# Patient Record
Sex: Male | Born: 1945 | Race: White | Hispanic: No | Marital: Married | Smoking: Never smoker
Health system: Southern US, Community
[De-identification: ages and names within clinical notes are randomized; demographics above are authoritative.]

## PROBLEM LIST (undated history)

## (undated) DIAGNOSIS — I10 Essential (primary) hypertension: Secondary | ICD-10-CM

## (undated) DIAGNOSIS — N200 Calculus of kidney: Secondary | ICD-10-CM

## (undated) DIAGNOSIS — E7800 Pure hypercholesterolemia, unspecified: Secondary | ICD-10-CM

## (undated) DIAGNOSIS — G4730 Sleep apnea, unspecified: Secondary | ICD-10-CM

## (undated) DIAGNOSIS — M1990 Unspecified osteoarthritis, unspecified site: Secondary | ICD-10-CM

## (undated) HISTORY — DX: Pure hypercholesterolemia, unspecified: E78.00

## (undated) HISTORY — DX: Unspecified osteoarthritis, unspecified site: M19.90

## (undated) HISTORY — DX: Calculus of kidney: N20.0

## (undated) HISTORY — DX: Essential (primary) hypertension: I10

## (undated) HISTORY — DX: Sleep apnea, unspecified: G47.30

---

## 2008-08-06 ENCOUNTER — Emergency Department: Payer: Self-pay | Admitting: Emergency Medicine

## 2008-08-08 ENCOUNTER — Ambulatory Visit: Payer: Self-pay | Admitting: Urology

## 2008-08-10 ENCOUNTER — Ambulatory Visit: Payer: Self-pay | Admitting: Urology

## 2008-08-18 ENCOUNTER — Ambulatory Visit: Payer: Self-pay | Admitting: Urology

## 2008-09-07 ENCOUNTER — Ambulatory Visit: Payer: Self-pay | Admitting: Urology

## 2008-11-10 ENCOUNTER — Ambulatory Visit: Payer: Self-pay | Admitting: Urology

## 2009-03-21 ENCOUNTER — Ambulatory Visit: Payer: Self-pay | Admitting: Gastroenterology

## 2011-02-21 ENCOUNTER — Ambulatory Visit: Payer: Self-pay | Admitting: Orthopedic Surgery

## 2011-08-25 ENCOUNTER — Ambulatory Visit: Payer: Self-pay | Admitting: Urology

## 2011-12-04 ENCOUNTER — Ambulatory Visit: Payer: Self-pay | Admitting: Internal Medicine

## 2012-09-06 ENCOUNTER — Ambulatory Visit: Payer: Self-pay | Admitting: Urology

## 2014-03-27 ENCOUNTER — Ambulatory Visit: Payer: Self-pay | Admitting: Gastroenterology

## 2014-05-28 NOTE — Op Note (Signed)
PATIENT NAME:  Joshua Charles, Joshua Charles MR#:  [id number] DATE OF BIRTH:  1946/02/02  DATE OF PROCEDURE:  02/21/2011  PREOPERATIVE DIAGNOSIS: Left wrist volar ganglion cyst.   POSTOPERATIVE DIAGNOSIS: Left wrist volar ganglion cyst.   PROCEDURE: Excision ganglion cyst left volar wrist.   SURGEON: Laurene Footman, MD  ANESTHESIA: General.    DESCRIPTION OF PROCEDURE: Patient was brought to the Operating Room and after adequate anesthesia was obtained left arm was prepped and draped in the usual sterile fashion with a tourniquet applied to the upper arm. After patient identification and timeout procedures were completed, the tourniquet was raised to 250 mmHg and a curvilinear incision was made over the mass. On opening the subcutaneous skin with blunt dissection the mass was approximately 0.5 cm in size. It was tracked down to its base and then the superficial portion cut off and sent as specimen. The base was identified arising from the radiocarpal joint and cauterized and then a small elevator used to abrade the adjacent tissue to cause scar tissue to prevent recurrence. After this had been completed, the wound was irrigated and the tourniquet let down. Radial artery was intact and there was minimal bleeding. The wounds were closed with simple interrupted 4-0 nylon skin closure. Next Xeroform, 4 x 4's, Webril, and a volar splint were applied. Patient tolerated the procedure well, sent to recovery room in stable condition.   ESTIMATED BLOOD LOSS: Minimal.   COMPLICATIONS: None.   SPECIMEN: Ganglion cyst.   TOURNIQUET TIME: 7 minutes at 250 mmHg. ____________________________ Laurene Footman, MD mjm:cms D: 02/21/2011 18:07:50 ET T: 02/22/2011 09:17:04 ET  JOB#: [id number] Joshua Mate [name] MD ELECTRONICALLY SIGNED 02/23/2011 14:48

## 2017-02-03 HISTORY — PX: OTHER SURGICAL HISTORY: SHX169

## 2017-10-16 ENCOUNTER — Encounter: Payer: Self-pay | Admitting: Urology

## 2017-10-16 ENCOUNTER — Ambulatory Visit
Admission: RE | Admit: 2017-10-16 | Discharge: 2017-10-16 | Disposition: A | Discharge status: Home / Self Care | Payer: Medicare HMO | Source: Ambulatory Visit | Attending: Urology | Admitting: Urology

## 2017-10-16 ENCOUNTER — Ambulatory Visit (INDEPENDENT_AMBULATORY_CARE_PROVIDER_SITE_OTHER): Payer: Medicare HMO | Admitting: Urology

## 2017-10-16 VITALS — BP 174/64 | HR 77 | Ht 66.0 in | Wt 141.5 lb

## 2017-10-16 DIAGNOSIS — N200 Calculus of kidney: Secondary | ICD-10-CM | POA: Insufficient documentation

## 2017-10-16 DIAGNOSIS — Z87442 Personal history of urinary calculi: Secondary | ICD-10-CM | POA: Diagnosis present

## 2017-10-16 DIAGNOSIS — Z029 Encounter for administrative examinations, unspecified: Secondary | ICD-10-CM | POA: Diagnosis present

## 2017-10-16 LAB — URINALYSIS, COMPLETE
Bilirubin, UA: NEGATIVE
Glucose, UA: NEGATIVE
Ketones, UA: NEGATIVE
LEUKOCYTES UA: NEGATIVE
Nitrite, UA: NEGATIVE
PH UA: 8 — AB (ref 5.0–7.5)
Protein, UA: NEGATIVE
RBC UA: NEGATIVE
SPEC GRAV UA: 1.02 (ref 1.005–1.030)
Urobilinogen, Ur: 0.2 mg/dL (ref 0.2–1.0)

## 2017-10-16 NOTE — Progress Notes (Signed)
10/16/2017 12:29 PM   Joshua Charles 13-Mar-1945 [id number]  Referring provider: No referring provider defined for this encounter.  Chief Complaint  Patient presents with  . Follow-up    HPI: 72 year old male with a history of stone disease presents for follow-up.  He is status post shockwave lithotripsy of a 5 x 6 mm right distal ureteral calculus in September 2016.  He has had no recurrent stones.  I last saw him at [facility] in October 2017.  He presently has no complaints and specifically denies flank, abdominal, pelvic or scrotal pain.  He denies bothersome lower urinary tract symptoms.  Denies dysuria or gross hematuria.  KUB in 2017 showed no evidence of recurrent calculi.   PMH: Past Medical History:  Diagnosis Date  . Arthritis   . High blood pressure   . High cholesterol   . Kidney stones   . Sleep apnea     Surgical History: Past Surgical History:  Procedure Laterality Date  . Eye  2019  . Inspire Device   2019    Home Medications:  Allergies as of 10/16/2017   Not on File     Medication List    as of 10/16/2017 12:29 PM   You have not been prescribed any medications.     Allergies: Allergies not on file  Family History: Family History  Problem Relation Age of Onset  . Hematuria Father   . Prostate cancer Father     Social History:  reports that he has never smoked. He has never used smokeless tobacco. He reports that he does not drink alcohol or use drugs.  ROS: UROLOGY Frequent Urination?: No Hard to postpone urination?: No Burning/pain with urination?: No Get up at night to urinate?: No Leakage of urine?: Yes Urine stream starts and stops?: No Trouble starting stream?: No Do you have to strain to urinate?: No Blood in urine?: No Urinary tract infection?: No Sexually transmitted disease?: No Injury to kidneys or bladder?: No Painful intercourse?: No Weak stream?: Yes Erection problems?: No Penile pain?:  No  Gastrointestinal Nausea?: No Vomiting?: No Indigestion/heartburn?: No Diarrhea?: No Constipation?: No  Constitutional Fever: No Night sweats?: No Weight loss?: No Fatigue?: No  Skin Skin rash/lesions?: No Itching?: No  Eyes Blurred vision?: No Double vision?: No  Ears/Nose/Throat Sore throat?: No Sinus problems?: No  Hematologic/Lymphatic Swollen glands?: No Easy bruising?: Yes  Cardiovascular Leg swelling?: No Chest pain?: No  Respiratory Cough?: No Shortness of breath?: No  Endocrine Excessive thirst?: No  Musculoskeletal Back pain?: Yes Joint pain?: Yes  Neurological Headaches?: No Dizziness?: No  Psychologic Depression?: No Anxiety?: No  Physical Exam: BP (!) 174/64   Pulse 77   Ht 5\' 6"  (1.676 m)   Wt 141 lb 8 oz (64.2 kg)   BMI 22.84 kg/m   Constitutional:  Alert and oriented, No acute distress. HEENT: [place] AT, moist mucus membranes.  Trachea midline, no masses. Cardiovascular: No clubbing, cyanosis, or edema. Respiratory: Normal respiratory effort, no increased work of breathing. GI: Abdomen is soft, nontender, nondistended, no abdominal masses GU: No CVA tenderness Lymph: No cervical or inguinal lymphadenopathy. Skin: No rashes, bruises or suspicious lesions. Neurologic: Grossly intact, no focal deficits, moving all 4 extremities. Psychiatric: Normal mood and affect.  Laboratory Data:  Urinalysis Dipstick/microscopy negative  Assessment & Plan:   72 year old male with a history of stone disease.  He is presently asymptomatic.  A KUB was ordered and he will be notified with results.  Every other year follow-up  or visits as needed for stone symptoms.   Abbie Sons, [place] [address], [place],  [address] [phone]

## 2017-10-17 ENCOUNTER — Encounter: Payer: Self-pay | Admitting: Urology

## 2017-10-19 ENCOUNTER — Telehealth: Payer: Self-pay

## 2017-10-19 DIAGNOSIS — N200 Calculus of kidney: Secondary | ICD-10-CM

## 2017-10-19 NOTE — Telephone Encounter (Signed)
-----   Message from Abbie Sons, MD sent at 10/17/2017  8:38 PM EDT ----- KUB shows probable small bilateral renal calculi. Can monitor- recc kub 1 year

## 2017-10-19 NOTE — Telephone Encounter (Signed)
Called pt informed his wife of results (per DPR) wife gave verbal understanding.

## 2017-10-22 ENCOUNTER — Other Ambulatory Visit: Payer: Self-pay

## 2017-10-22 ENCOUNTER — Ambulatory Visit
Admission: RE | Admit: 2017-10-22 | Discharge: 2017-10-22 | Disposition: A | Discharge status: Home / Self Care | Payer: Medicare HMO | Attending: Urology | Admitting: Urology

## 2017-10-22 DIAGNOSIS — Z029 Encounter for administrative examinations, unspecified: Secondary | ICD-10-CM | POA: Insufficient documentation

## 2018-12-19 ENCOUNTER — Other Ambulatory Visit: Payer: Self-pay | Admitting: Urology

## 2018-12-19 DIAGNOSIS — N200 Calculus of kidney: Secondary | ICD-10-CM

## 2019-09-12 ENCOUNTER — Emergency Department (HOSPITAL_COMMUNITY)
Admission: EM | Admit: 2019-09-12 | Discharge: 2019-09-12 | Disposition: A | Discharge status: Home / Self Care | Payer: Medicare HMO | Attending: Emergency Medicine | Admitting: Emergency Medicine

## 2019-09-12 ENCOUNTER — Emergency Department (HOSPITAL_COMMUNITY): Payer: Medicare HMO

## 2019-09-12 ENCOUNTER — Other Ambulatory Visit: Payer: Self-pay

## 2019-09-12 ENCOUNTER — Encounter (HOSPITAL_COMMUNITY): Payer: Self-pay | Admitting: *Deleted

## 2019-09-12 DIAGNOSIS — Z7982 Long term (current) use of aspirin: Secondary | ICD-10-CM | POA: Diagnosis not present

## 2019-09-12 DIAGNOSIS — I10 Essential (primary) hypertension: Secondary | ICD-10-CM | POA: Diagnosis not present

## 2019-09-12 DIAGNOSIS — K529 Noninfective gastroenteritis and colitis, unspecified: Secondary | ICD-10-CM | POA: Insufficient documentation

## 2019-09-12 DIAGNOSIS — Z79899 Other long term (current) drug therapy: Secondary | ICD-10-CM | POA: Insufficient documentation

## 2019-09-12 DIAGNOSIS — R109 Unspecified abdominal pain: Secondary | ICD-10-CM | POA: Diagnosis present

## 2019-09-12 LAB — CBC
HCT: 44.9 % (ref 39.0–52.0)
Hemoglobin: 14.5 g/dL (ref 13.0–17.0)
MCH: 32.1 pg (ref 26.0–34.0)
MCHC: 32.3 g/dL (ref 30.0–36.0)
MCV: 99.3 fL (ref 80.0–100.0)
Platelets: 308 10*3/uL (ref 150–400)
RBC: 4.52 MIL/uL (ref 4.22–5.81)
RDW: 13.2 % (ref 11.5–15.5)
WBC: 13.9 10*3/uL — ABNORMAL HIGH (ref 4.0–10.5)
nRBC: 0 % (ref 0.0–0.2)

## 2019-09-12 LAB — COMPREHENSIVE METABOLIC PANEL
ALT: 15 U/L (ref 0–44)
AST: 11 U/L — ABNORMAL LOW (ref 15–41)
Albumin: 4.1 g/dL (ref 3.5–5.0)
Alkaline Phosphatase: 38 U/L (ref 38–126)
Anion gap: 8 (ref 5–15)
BUN: 15 mg/dL (ref 8–23)
CO2: 26 mmol/L (ref 22–32)
Calcium: 8.7 mg/dL — ABNORMAL LOW (ref 8.9–10.3)
Chloride: 104 mmol/L (ref 98–111)
Creatinine, Ser: 1.09 mg/dL (ref 0.61–1.24)
GFR calc Af Amer: >60 mL/min (ref >60)
GFR calc non Af Amer: >60 mL/min (ref >60)
Glucose, Bld: 136 mg/dL — ABNORMAL HIGH (ref 70–99)
Potassium: 4.6 mmol/L (ref 3.5–5.1)
Sodium: 138 mmol/L (ref 135–145)
Total Bilirubin: 0.4 mg/dL (ref 0.3–1.2)
Total Protein: 7.1 g/dL (ref 6.5–8.1)

## 2019-09-12 LAB — URINALYSIS, ROUTINE W REFLEX MICROSCOPIC
Bacteria, UA: NONE SEEN
Bilirubin Urine: NEGATIVE
Glucose, UA: NEGATIVE mg/dL
Ketones, ur: 5 mg/dL — AB
Leukocytes,Ua: NEGATIVE
Nitrite: NEGATIVE
Protein, ur: 100 mg/dL — AB
Specific Gravity, Urine: 1.039 — ABNORMAL HIGH (ref 1.005–1.030)
pH: 5 (ref 5.0–8.0)

## 2019-09-12 LAB — LIPASE, BLOOD: Lipase: 62 U/L — ABNORMAL HIGH (ref 11–51)

## 2019-09-12 LAB — POC OCCULT BLOOD, ED: Fecal Occult Bld: POSITIVE — AB

## 2019-09-12 MED ORDER — AMOXICILLIN-POT CLAVULANATE 875-125 MG PO TABS: 1.0000 | ORAL_TABLET | Freq: Two times a day (BID) | ORAL | 0 refills | Status: DC

## 2019-09-12 MED ORDER — SODIUM CHLORIDE 0.9% FLUSH
3.0000 mL | Freq: Once | INTRAVENOUS | Status: AC
  Administered 2019-09-12: 3 mL via INTRAVENOUS

## 2019-09-12 MED ORDER — ONDANSETRON HCL 4 MG/2ML IJ SOLN
4.0000 mg | Freq: Once | INTRAMUSCULAR | Status: AC
  Administered 2019-09-12: 4 mg via INTRAVENOUS
  Filled 2019-09-12: qty 2

## 2019-09-12 MED ORDER — IOHEXOL 300 MG/ML  SOLN
100.0000 mL | Freq: Once | INTRAMUSCULAR | Status: AC | PRN
  Administered 2019-09-12: 100 mL via INTRAVENOUS

## 2019-09-12 MED ORDER — ONDANSETRON 4 MG PO TBDP: 4.0000 mg | ORAL_TABLET | Freq: Three times a day (TID) | ORAL | 0 refills | Status: AC | PRN

## 2019-09-12 MED ORDER — MORPHINE SULFATE (PF) 2 MG/ML IV SOLN
2.0000 mg | Freq: Once | INTRAVENOUS | Status: AC
  Administered 2019-09-12: 2 mg via INTRAVENOUS
  Filled 2019-09-12: qty 1

## 2019-09-12 NOTE — Discharge Instructions (Addendum)
Take Augmentin as prescribed.  Take Zofran as needed for nausea.  Please follow-up with your primary care provider in 2 5 days for continued evaluation.  Return to the emergency room immediately for new or worsening symptoms or concerns, such as new or worsening abdominal pain, fevers, vomiting or any concerns at all.

## 2019-09-12 NOTE — ED Triage Notes (Signed)
Right lower quadrant pain x 2 days with some bloody stools

## 2019-09-12 NOTE — ED Provider Notes (Signed)
[facility] EMERGENCY DEPARTMENT Provider Note   CSN: [id number] Arrival date & time: 09/12/19  1207     History Chief Complaint  Patient presents with  . Abdominal Pain    Joshua Charles is a 74 y.o. male.  HPI  74 year old male, with a PMH of HTN, HLD, presents with right lower quadrant abdominal pain.  Patient states her last 2 days he has had right lower quadrant abdominal pain.  He denies any radiation of pain, aggravating or alleviating factors.  He notes associated nausea and vomiting this morning x1.  He states diarrhea daily with approximately 10 episodes of loose stools.  He notes that he has had some bright red blood intermittently in his stools, noted in the toilet with several drops of bright red blood.  States today he had bright red blood noted on the toilet paper.  He denies any hematuria, dysuria, urinary urgency.  He denies any fevers, chills, chest pain, shortness of breath.    Past Medical History:  Diagnosis Date  . Arthritis   . High blood pressure   . High cholesterol   . Kidney stones   . Sleep apnea     There are no problems to display for this patient.   Past Surgical History:  Procedure Laterality Date  . Eye  2019  . Inspire Device   2019       Family History  Problem Relation Age of Onset  . Hematuria Father   . Prostate cancer Father     Social History   Tobacco Use  . Smoking status: Never Smoker  . Smokeless tobacco: Never Used  Substance Use Topics  . Alcohol use: Never  . Drug use: Never    Home Medications Prior to Admission medications   Medication Sig Start Date End Date Taking? Authorizing Provider  acetaminophen (TYLENOL) 500 MG tablet Take 1 tablet by mouth every 6 (six) hours as needed for pain.   Yes [provider]  aspirin 81 MG EC tablet Take 1 tablet by mouth daily. 03/15/13  Yes [provider]  losartan (COZAAR) 25 MG tablet Take 25 mg by mouth daily. 08/16/19  Yes [provider]   simvastatin (ZOCOR) 40 MG tablet Take 40 mg by mouth at bedtime. 07/18/19  Yes [provider]    Allergies    Patient has no known allergies.  Review of Systems   Review of Systems  Constitutional: Negative for chills and fever.  Respiratory: Negative for shortness of breath.   Cardiovascular: Negative for chest pain.  Gastrointestinal: Positive for abdominal pain, diarrhea, nausea and vomiting.  Genitourinary: Negative for hematuria and testicular pain.  All other systems reviewed and are negative.   Physical Exam Updated Vital Signs BP (!) 173/81 (BP Location: Right Arm)   Pulse 77   Temp 97.6 F (36.4 C) (Oral)   Resp 20   Ht 5\' 6"  (1.676 m)   Wt 64.9 kg   SpO2 100%   BMI 23.08 kg/m   Physical Exam Vitals and nursing note reviewed.  Constitutional:      Appearance: He is well-developed.  HENT:     Head: Normocephalic and atraumatic.  Eyes:     Conjunctiva/sclera: Conjunctivae normal.  Cardiovascular:     Rate and Rhythm: Normal rate and regular rhythm.     Heart sounds: Normal heart sounds. No murmur heard.   Pulmonary:     Effort: Pulmonary effort is normal. No respiratory distress.     Breath sounds:  Normal breath sounds. No wheezing or rales.  Abdominal:     General: Bowel sounds are normal. There is no distension.     Palpations: Abdomen is soft.     Tenderness: There is abdominal tenderness in the right lower quadrant. There is guarding. Positive signs include McBurney's sign.  Musculoskeletal:        General: No tenderness or deformity. Normal range of motion.     Cervical back: Neck supple.  Skin:    General: Skin is warm and dry.     Findings: No erythema or rash.  Neurological:     Mental Status: He is alert and oriented to person, place, and time.  Psychiatric:        Behavior: Behavior normal.     ED Results / Procedures / Treatments   Labs (all labs ordered are listed, but only abnormal results are displayed) Labs Reviewed    LIPASE, BLOOD - Abnormal; Notable for the following components:      Result Value   Lipase 62 (*)    All other components within normal limits  COMPREHENSIVE METABOLIC PANEL - Abnormal; Notable for the following components:   Glucose, Bld 136 (*)    Calcium 8.7 (*)    AST 11 (*)    All other components within normal limits  CBC - Abnormal; Notable for the following components:   WBC 13.9 (*)    All other components within normal limits  URINALYSIS, ROUTINE W REFLEX MICROSCOPIC - Abnormal; Notable for the following components:   Color, Urine AMBER (*)    APPearance HAZY (*)    Specific Gravity, Urine 1.039 (*)    Hgb urine dipstick SMALL (*)    Ketones, ur 5 (*)    Protein, ur 100 (*)    All other components within normal limits  POC OCCULT BLOOD, ED - Abnormal; Notable for the following components:   Fecal Occult Bld POSITIVE (*)    All other components within normal limits    EKG None  Radiology CT Abdomen Pelvis W Contrast  Result Date: 09/12/2019 CLINICAL DATA:  Right lower quadrant pain for 2 days.  Bloody stool. EXAM: CT ABDOMEN AND PELVIS WITH CONTRAST TECHNIQUE: Multidetector CT imaging of the abdomen and pelvis was performed using the standard protocol following bolus administration of intravenous contrast. CONTRAST:  156mL OMNIPAQUE IOHEXOL 300 MG/ML  SOLN COMPARISON:  [facility] CT [facility] FINDINGS: Lower Chest: No acute findings. Hepatobiliary: No hepatic masses identified. Gallbladder is unremarkable. No evidence of biliary ductal dilatation. Pancreas:  No mass or inflammatory changes. Spleen: Within normal limits in size and appearance. Adrenals/Urinary Tract: No masses identified. A few small cysts are noted in the upper pole of the left kidney. 2 mm nonobstructing calculus is seen in the midpole of the right kidney. No evidence of ureteral calculi or hydronephrosis. Stomach/Bowel: Moderate diffuse colonic wall thickening is  seen with greatest involvement of the ascending colon, consistent with severe colitis. Tiny amount of free fluid is noted in the right paracolic gutter and pelvic cul-de-sac, however there is no evidence of abscess, pneumatosis, or extraluminal air. Small bowel is unremarkable in appearance, and there is no evidence of bowel obstruction. Diverticulosis is seen in the sigmoid colon. Vascular/Lymphatic: No pathologically enlarged lymph nodes. No abdominal aortic aneurysm. Aortic atherosclerosis noted. Reproductive:  No mass or other significant abnormality. Other:  None. Musculoskeletal:  No suspicious bone lesions identified. IMPRESSION: Moderate to severe diffuse colitis, likely infectious in etiology with  less likely differential diagnosis of ulcerative colitis. Tiny nonobstructing right renal calculus. Aortic Atherosclerosis (ICD10-I70.0). Electronically Signed   By: Marlaine Hind M.D.   On: 09/12/2019 16:48    Procedures Procedures (including critical care time)  Medications Ordered in ED Medications  sodium chloride flush (NS) 0.9 % injection 3 mL (3 mLs Intravenous Given 09/12/19 1548)  morphine 2 MG/ML injection 2 mg (2 mg Intravenous Given 09/12/19 1547)  ondansetron (ZOFRAN) injection 4 mg (4 mg Intravenous Given 09/12/19 1547)  iohexol (OMNIPAQUE) 300 MG/ML solution 100 mL (100 mLs Intravenous Contrast Given 09/12/19 1620)    ED Course  I have reviewed the triage vital signs and the nursing notes.  Pertinent labs & imaging results that were available during my care of the patient were reviewed by me and considered in my medical decision making (see chart for details).    MDM Rules/Calculators/A&P                           Patient presents with right lower quadrant abdominal pain.  He did note multiple episodes of diarrhea and one episode of vomiting today.  He is feeling better after morphine and Zofran.  His CBC shows a slightly elevated white count.  CMP with normal kidney function, normal  LFTs.  Lipase only slightly elevated.  Fecal occult positive however denies a large amount of bright red blood.  He notes only several drops in the toilet.  Hemoglobin is normal.  He received a CT scan which shows moderate to severe colitis.  Discussed outpatient management and patient and wife are agreeable.  Will discharge with Augmentin.  Given very strict return precautions and patient is agreeable with plan.  He is ready and stable for discharge.    At this time there does not appear to be any evidence of an acute emergency medical condition and the patient appears stable for discharge with appropriate outpatient follow up.Diagnosis was discussed with patient who verbalizes understanding and is agreeable to discharge. Pt case discussed with Dr. Rogene Houston who agrees with my plan.     Final Clinical Impression(s) / ED Diagnoses Final diagnoses:  None    Rx / DC Orders ED Discharge Orders    None       Rachel Moulds 09/12/19 2025    Fredia Sorrow, MD 09/13/19 1556

## 2019-09-12 NOTE — ED Provider Notes (Signed)
Medical screening examination/treatment/procedure(s) were conducted as a shared visit with non-physician practitioner(s) and myself.  I personally evaluated the patient during the encounter.     Patient seen by me along with physician assistant.  Patient presenting with right lower quadrant abdominal pain for 2 days and some bloody stools.  Patient's vital signs without any significant abnormalities.  Hemoccult positive.  Labs significant for leukocytosis.  CT scan of the abdomen consistent with a colitis.  Listed as a moderate to severe diffuse colitis.  Most likely infectious in etiology.  They feel it is not related ulcerative colitis.  Otherwise no significant findings.  Patient will be treated with Augmentin follow-up with primary care doctor or return for any new or worse symptoms.  Patient stable for discharge home nontoxic no acute distress.    Fredia Sorrow, MD 09/12/19 [phone]

## 2019-10-28 ENCOUNTER — Ambulatory Visit: Payer: Self-pay

## 2021-01-12 ENCOUNTER — Emergency Department (HOSPITAL_COMMUNITY)
Admission: EM | Admit: 2021-01-12 | Discharge: 2021-01-12 | Disposition: A | Discharge status: Home / Self Care | Payer: Medicare HMO | Attending: Emergency Medicine | Admitting: Emergency Medicine

## 2021-01-12 ENCOUNTER — Emergency Department (HOSPITAL_COMMUNITY): Payer: Medicare HMO

## 2021-01-12 ENCOUNTER — Ambulatory Visit: Admission: EM | Admit: 2021-01-12 | Discharge: 2021-01-12 | Payer: Medicare HMO

## 2021-01-12 ENCOUNTER — Other Ambulatory Visit: Payer: Self-pay

## 2021-01-12 ENCOUNTER — Encounter: Payer: Self-pay | Admitting: Emergency Medicine

## 2021-01-12 ENCOUNTER — Encounter (HOSPITAL_COMMUNITY): Payer: Self-pay | Admitting: *Deleted

## 2021-01-12 DIAGNOSIS — M533 Sacrococcygeal disorders, not elsewhere classified: Secondary | ICD-10-CM

## 2021-01-12 DIAGNOSIS — K5730 Diverticulosis of large intestine without perforation or abscess without bleeding: Secondary | ICD-10-CM | POA: Insufficient documentation

## 2021-01-12 DIAGNOSIS — W19XXXA Unspecified fall, initial encounter: Secondary | ICD-10-CM

## 2021-01-12 DIAGNOSIS — S22070A Wedge compression fracture of T9-T10 vertebra, initial encounter for closed fracture: Secondary | ICD-10-CM

## 2021-01-12 DIAGNOSIS — M2550 Pain in unspecified joint: Secondary | ICD-10-CM

## 2021-01-12 DIAGNOSIS — I10 Essential (primary) hypertension: Secondary | ICD-10-CM | POA: Insufficient documentation

## 2021-01-12 DIAGNOSIS — W11XXXA Fall on and from ladder, initial encounter: Secondary | ICD-10-CM | POA: Diagnosis not present

## 2021-01-12 DIAGNOSIS — M79642 Pain in left hand: Secondary | ICD-10-CM

## 2021-01-12 DIAGNOSIS — I700 Atherosclerosis of aorta: Secondary | ICD-10-CM | POA: Diagnosis not present

## 2021-01-12 DIAGNOSIS — M79652 Pain in left thigh: Secondary | ICD-10-CM | POA: Insufficient documentation

## 2021-01-12 DIAGNOSIS — M542 Cervicalgia: Secondary | ICD-10-CM

## 2021-01-12 DIAGNOSIS — M4854XA Collapsed vertebra, not elsewhere classified, thoracic region, initial encounter for fracture: Secondary | ICD-10-CM | POA: Insufficient documentation

## 2021-01-12 DIAGNOSIS — S0001XA Abrasion of scalp, initial encounter: Secondary | ICD-10-CM | POA: Diagnosis not present

## 2021-01-12 DIAGNOSIS — K5792 Diverticulitis of intestine, part unspecified, without perforation or abscess without bleeding: Secondary | ICD-10-CM | POA: Insufficient documentation

## 2021-01-12 DIAGNOSIS — M546 Pain in thoracic spine: Secondary | ICD-10-CM

## 2021-01-12 DIAGNOSIS — M549 Dorsalgia, unspecified: Secondary | ICD-10-CM | POA: Diagnosis present

## 2021-01-12 DIAGNOSIS — M545 Low back pain, unspecified: Secondary | ICD-10-CM

## 2021-01-12 DIAGNOSIS — R220 Localized swelling, mass and lump, head: Secondary | ICD-10-CM | POA: Diagnosis not present

## 2021-01-12 DIAGNOSIS — S0990XA Unspecified injury of head, initial encounter: Secondary | ICD-10-CM

## 2021-01-12 LAB — CBC WITH DIFFERENTIAL/PLATELET
Abs Immature Granulocytes: 0.02 10*3/uL (ref 0.00–0.07)
Basophils Absolute: 0 10*3/uL (ref 0.0–0.1)
Basophils Relative: 0 %
Eosinophils Absolute: 0.1 10*3/uL (ref 0.0–0.5)
Eosinophils Relative: 1 %
HCT: 39.3 % (ref 39.0–52.0)
Hemoglobin: 12.8 g/dL — ABNORMAL LOW (ref 13.0–17.0)
Immature Granulocytes: 0 %
Lymphocytes Relative: 17 %
Lymphs Abs: 1.2 10*3/uL (ref 0.7–4.0)
MCH: 32.6 pg (ref 26.0–34.0)
MCHC: 32.6 g/dL (ref 30.0–36.0)
MCV: 100 fL (ref 80.0–100.0)
Monocytes Absolute: 0.5 10*3/uL (ref 0.1–1.0)
Monocytes Relative: 7 %
Neutro Abs: 5.1 10*3/uL (ref 1.7–7.7)
Neutrophils Relative %: 75 %
Platelets: 317 10*3/uL (ref 150–400)
RBC: 3.93 MIL/uL — ABNORMAL LOW (ref 4.22–5.81)
RDW: 13.2 % (ref 11.5–15.5)
WBC: 6.8 10*3/uL (ref 4.0–10.5)
nRBC: 0 % (ref 0.0–0.2)

## 2021-01-12 LAB — I-STAT CHEM 8, ED
BUN: 21 mg/dL (ref 8–23)
Calcium, Ion: 1.14 mmol/L — ABNORMAL LOW (ref 1.15–1.40)
Chloride: 107 mmol/L (ref 98–111)
Creatinine, Ser: 1.3 mg/dL — ABNORMAL HIGH (ref 0.61–1.24)
Glucose, Bld: 103 mg/dL — ABNORMAL HIGH (ref 70–99)
HCT: 38 % — ABNORMAL LOW (ref 39.0–52.0)
Hemoglobin: 12.9 g/dL — ABNORMAL LOW (ref 13.0–17.0)
Potassium: 4.3 mmol/L (ref 3.5–5.1)
Sodium: 141 mmol/L (ref 135–145)
TCO2: 24 mmol/L (ref 22–32)

## 2021-01-12 LAB — URINALYSIS, ROUTINE W REFLEX MICROSCOPIC
Bilirubin Urine: NEGATIVE
Glucose, UA: NEGATIVE mg/dL
Ketones, ur: NEGATIVE mg/dL
Leukocytes,Ua: NEGATIVE
Nitrite: NEGATIVE
Protein, ur: NEGATIVE mg/dL
Specific Gravity, Urine: 1.02 (ref 1.005–1.030)
pH: 5.5 (ref 5.0–8.0)

## 2021-01-12 LAB — URINALYSIS, MICROSCOPIC (REFLEX)

## 2021-01-12 MED ORDER — FENTANYL CITRATE PF 50 MCG/ML IJ SOSY
50.0000 ug | PREFILLED_SYRINGE | Freq: Once | INTRAMUSCULAR | Status: AC
  Administered 2021-01-12: 50 ug via INTRAVENOUS

## 2021-01-12 MED ORDER — ACETAMINOPHEN 500 MG PO TABS: 500.0000 mg | ORAL_TABLET | Freq: Four times a day (QID) | ORAL | 0 refills | Status: AC | PRN

## 2021-01-12 MED ORDER — FENTANYL CITRATE PF 50 MCG/ML IJ SOSY
50.0000 ug | PREFILLED_SYRINGE | Freq: Once | INTRAMUSCULAR | Status: DC
  Filled 2021-01-12: qty 1

## 2021-01-12 MED ORDER — IBUPROFEN 600 MG PO TABS: 600.0000 mg | ORAL_TABLET | Freq: Four times a day (QID) | ORAL | 0 refills | Status: AC | PRN

## 2021-01-12 MED ORDER — OXYCODONE HCL 5 MG PO TABS: 5.0000 mg | ORAL_TABLET | ORAL | 0 refills | Status: AC | PRN

## 2021-01-12 NOTE — ED Provider Notes (Signed)
Joshua Charles   MRN: [id number] DOB: 08/28/1945  Subjective:   Joshua Charles is a 75 y.o. male presenting for suffering a significant fall off a flatbed of a truck.  He was cutting a tree limb down and then fell but cannot recall exactly how he fell.  Does not believe that he had loss of consciousness.  Reports that he had the wind knocked out of him and had difficulty breathing for that.  Has pain with deep inspiration of the thoracic back.  Has neck pain, thoracic back pain, lumbar pain, coccygeal pain, left hand pain, left thigh pain.  He also notes that he has had a scrape across his right forehead.  Tdap was updated 2 weeks ago.  No current facility-administered medications for this encounter.  Current Outpatient Medications:    acetaminophen (TYLENOL) 500 MG tablet, Take 1 tablet by mouth every 6 (six) hours as needed for pain., Disp: , Rfl:    amoxicillin-clavulanate (AUGMENTIN) 875-125 MG tablet, Take 1 tablet by mouth every 12 (twelve) hours., Disp: 14 tablet, Rfl: 0   aspirin 81 MG EC tablet, Take 1 tablet by mouth daily., Disp: , Rfl:    losartan (COZAAR) 25 MG tablet, Take 25 mg by mouth daily., Disp: , Rfl:    ondansetron (ZOFRAN ODT) 4 MG disintegrating tablet, Take 1 tablet (4 mg total) by mouth every 8 (eight) hours as needed for nausea or vomiting., Disp: 20 tablet, Rfl: 0   simvastatin (ZOCOR) 40 MG tablet, Take 40 mg by mouth at bedtime., Disp: , Rfl:    No Known Allergies  Past Medical History:  Diagnosis Date   Arthritis    High blood pressure    High cholesterol    Kidney stones    Sleep apnea      Past Surgical History:  Procedure Laterality Date   Eye  2019   Inspire Device   2019    Family History  Problem Relation Age of Onset   Hematuria Father    Prostate cancer Father     Social History   Tobacco Use   Smoking status: Never   Smokeless tobacco: Never  Substance Use Topics   Alcohol use: Never   Drug use: Never     ROS   Objective:   Vitals: BP (!) 166/81 (BP Location: Right Arm)   Pulse (!) 58   Temp 98.1 F (36.7 C) (Oral)   Resp 18   SpO2 99%   Physical Exam Constitutional:      General: He is not in acute distress.    Appearance: Normal appearance. He is well-developed. He is not ill-appearing, toxic-appearing or diaphoretic.  HENT:     Head: Normocephalic and atraumatic.      Right Ear: External ear normal.     Left Ear: External ear normal.     Nose: Nose normal.     Mouth/Throat:     Mouth: Mucous membranes are moist.     Pharynx: Oropharynx is clear.  Eyes:     General: No scleral icterus.    Extraocular Movements: Extraocular movements intact.     Pupils: Pupils are equal, round, and reactive to light.  Cardiovascular:     Rate and Rhythm: Normal rate and regular rhythm.     Heart sounds: Normal heart sounds. No murmur heard.   No friction rub. No gallop.  Pulmonary:     Effort: Pulmonary effort is normal. No respiratory distress.     Breath sounds: Normal breath sounds.  No stridor. No wheezing, rhonchi or rales.  Musculoskeletal:     Comments: Full range of motion throughout.  Unable to evaluate his strength as he feels significant back pain with range of motion and strength testing.  Patient ambulates without any assistance at expected pace.  No ecchymosis, swelling, lacerations or abrasions.  Patient does have paraspinal muscle tenderness along the entire back including the midline worse over the thoracic region and coccyx.    Neurological:     Mental Status: He is alert and oriented to person, place, and time.  Psychiatric:        Mood and Affect: Mood normal.        Behavior: Behavior normal.        Thought Content: Thought content normal.    Assessment and Plan :   PDMP not reviewed this encounter.  1. Multiple joint pain   2. Fall, initial encounter   3. Neck pain   4. Acute midline thoracic back pain   5. Acute midline low back pain without  sciatica   6. Coccyx pain   7. Left hand pain   8. Left thigh pain    Patient is in need of higher level of evaluation that we can provide.  He would benefit from extensive imaging that we cannot do in the urgent care setting.  Recommended he present to the emergency room for this.  He is agreeable to do so.   Jaynee Eagles, PA-C 01/12/21 1137

## 2021-01-12 NOTE — ED Notes (Signed)
Patient transported to CT and XR 

## 2021-01-12 NOTE — ED Triage Notes (Signed)
Pt states he hit his head on the tractor wheel but denies any LOC.  Denies taking any blood thinners.

## 2021-01-12 NOTE — Discharge Instructions (Addendum)
Seen here today for evaluation after a fall.  Your imaging showed that you have a T10 compression fracture of your spine.  You will need to follow-up with neurosurgery for a reevaluation.  Neurosurgery information attached to this chart.  Please call to schedule an appointment.  You have been scribed ibuprofen, Tylenol, and oxycodone.  You can rotate between the Tylenol and ibuprofen taking every 4 hours as needed.  For example you can take Tylenol and 4 hours later take ibuprofen, then 4 hours later take Tylenol.  You can take the oxycodone 5 mg at night as needed for sleep.  Please do not operate heavy machinery or drive while on this medication as it can make you drowsy.  If you have any weakness, numbness or tingling, urinary retention, fecal or urinary incontinence, chest pain, syncope, please return to the nearest emergency department for reevaluation.

## 2021-01-12 NOTE — ED Triage Notes (Signed)
Pt states he fell yesterday morning off a flat bed truck after a limb was cut off a tree and threw pt off balance.  Pt seen at urgent care and sent here for imaging. Pt able to ambulate with cane.

## 2021-01-12 NOTE — ED Notes (Signed)
Pt currently in CT/Xray will update vital signs upon arrival back to department.

## 2021-01-12 NOTE — ED Notes (Signed)
Reminded pt I needed a  urine sample

## 2021-01-12 NOTE — ED Provider Notes (Signed)
[facility] EMERGENCY DEPARTMENT Provider Note   CSN: [id number] Arrival date & time: 01/12/21  1156     History Chief Complaint  Patient presents with   Lytle Michaels    Joshua Charles is a 75 y.o. male with h/o HTN and HLD presents to the ED for evaluation after an 8 to 9 foot fall off a ladder yesterday around 83. Denies any blood thinner use. Patient reports he was cutting a limb off a tree when the tree hit the ladder and knocked him off and he fell landing on his back.  Denies any LOC.  Patient reports he thinks he hit his head on one of the tractor tires going down.  He denies any neck pain.  Denies any blurry vision or visual changes.  Denies any chest pain or shortness of breath.  Denies any abdominal pain, nausea or vomiting.  He reports he has had normal urinations and bowel movements since.  He is complaining of left scapular pain, diffuse back pain, left hand pain, and left thigh pain.  No orthopedic surgeries.  No known drug allergies.  Denies any tobacco, EtOH, or illicit drug use.   Fall Pertinent negatives include no chest pain, no abdominal pain, no headaches and no shortness of breath.      Past Medical History:  Diagnosis Date   Arthritis    High blood pressure    High cholesterol    Kidney stones    Sleep apnea     There are no problems to display for this patient.   Past Surgical History:  Procedure Laterality Date   Eye  2019   Inspire Device   2019       Family History  Problem Relation Age of Onset   Hematuria Father    Prostate cancer Father     Social History   Tobacco Use   Smoking status: Never   Smokeless tobacco: Never  Substance Use Topics   Alcohol use: Never   Drug use: Never    Home Medications Prior to Admission medications   Medication Sig Start Date End Date Taking? Authorizing Provider  acetaminophen (TYLENOL) 500 MG tablet Take 1 tablet (500 mg total) by mouth every 6 (six) hours as needed. 01/12/21  Yes Sherrell Puller,  PA-C  fluticasone [facility]) 50 MCG/ACT nasal spray Place 2 sprays into both nostrils daily. 12/05/20  Yes [provider]  ibuprofen (ADVIL) 600 MG tablet Take 1 tablet (600 mg total) by mouth every 6 (six) hours as needed. 01/12/21  Yes Sherrell Puller, PA-C  losartan (COZAAR) 25 MG tablet Take 25 mg by mouth daily. 08/16/19  Yes [provider]  oxyCODONE (OXY IR/ROXICODONE) 5 MG immediate release tablet Take 1 tablet (5 mg total) by mouth every 4 (four) hours as needed for up to 5 days for severe pain. 01/12/21 01/17/21 Yes Sherrell Puller, PA-C  simvastatin (ZOCOR) 40 MG tablet Take 40 mg by mouth at bedtime. 07/18/19  Yes [provider]  aspirin 81 MG EC tablet Take 1 tablet by mouth daily. Patient not taking: Reported on 01/12/2021 03/15/13   [provider]  ondansetron (ZOFRAN ODT) 4 MG disintegrating tablet Take 1 tablet (4 mg total) by mouth every 8 (eight) hours as needed for nausea or vomiting. Patient not taking: Reported on 01/12/2021 09/12/19   Etter Sjogren, PA-C    Allergies    Patient has no known allergies.  Review of Systems   Review of Systems  Constitutional:  Negative for  chills and fever.  HENT:  Negative for ear pain and sore throat.   Eyes:  Negative for pain and visual disturbance.  Respiratory:  Negative for cough and shortness of breath.   Cardiovascular:  Negative for chest pain and palpitations.  Gastrointestinal:  Negative for abdominal pain and vomiting.  Genitourinary:  Negative for dysuria and hematuria.  Musculoskeletal:  Positive for arthralgias, back pain, myalgias and neck pain. Negative for gait problem and neck stiffness.  Skin:  Positive for wound. Negative for color change and rash.  Neurological:  Negative for dizziness, seizures, syncope, speech difficulty, weakness, light-headedness, numbness and headaches.  All other systems reviewed and are negative.  Physical Exam Updated Vital Signs BP (!) 148/81    Pulse (!) 55   Temp 98 F (36.7 C) (Oral)   Resp 14   Ht 5\' 6"  (1.676 m)   Wt 68 kg   SpO2 94%   BMI 24.21 kg/m   Physical Exam Vitals and nursing note reviewed.  Constitutional:      General: He is not in acute distress.    Appearance: Normal appearance. He is not ill-appearing or toxic-appearing.  HENT:     Head: Normocephalic.     Comments: No bony step-offs or deformities palpated or visualized.  Patient has a superficial abrasion to the right frontal scalp.  Scabbed, nonbleeding.    Right Ear: Tympanic membrane, ear canal and external ear normal.     Left Ear: Tympanic membrane, ear canal and external ear normal.     Ears:     Comments: No hemotympanums    Nose: Nose normal.     Mouth/Throat:     Mouth: Mucous membranes are moist.     Pharynx: No oropharyngeal exudate or posterior oropharyngeal erythema.  Eyes:     General: No scleral icterus.    Extraocular Movements: Extraocular movements intact.     Pupils: Pupils are equal, round, and reactive to light.  Neck:     Comments: No step-offs or deformities palpated or visualized.  No overlying skin changes noted. Cardiovascular:     Rate and Rhythm: Normal rate and regular rhythm.     Pulses: Normal pulses.     Comments: DP and PT pulses intact bilaterally.  Radial pulses intact bilaterally. Pulmonary:     Effort: Pulmonary effort is normal. No respiratory distress.     Breath sounds: Normal breath sounds.     Comments: Equal breath sounds bilaterally.  No respiratory distress.  No tripoding, nasal flaring, cyanosis, or accessory muscle use present. Abdominal:     General: Abdomen is flat. Bowel sounds are normal.     Palpations: Abdomen is soft.     Tenderness: There is no abdominal tenderness. There is no guarding or rebound.     Comments: Overlying skin changes, erythema, ecchymosis, rash or abrasion noted to the area.  Nontender.  No guarding or rebound.  Normoactive bowel sounds.  Musculoskeletal:         General: Swelling and tenderness present. No deformity.     Cervical back: Normal range of motion. Tenderness present. No rigidity.     Right lower leg: No edema.     Left lower leg: No edema.     Comments: Tenderness to midline thoracic/lumbar spine.  No paraspinal tenderness.  No overlying skin changes, erythema, ecchymosis, rash, or abrasion noted to the area.  No bony step-offs or deformities visualized or palpated.  Ambulatory.  Compartments soft.  Sensation intact.  Ecchymosis to the left  ring finger.  Full range of motion.  Tender palpation of the left medial thigh.  Skin:    General: Skin is warm and dry.  Neurological:     General: No focal deficit present.     Mental Status: He is alert. Mental status is at baseline.     Cranial Nerves: No cranial nerve deficit.     Sensory: No sensory deficit.     Motor: No weakness.     Gait: Gait normal.    ED Results / Procedures / Treatments   Labs (all labs ordered are listed, but only abnormal results are displayed) Labs Reviewed  URINALYSIS, ROUTINE W REFLEX MICROSCOPIC - Abnormal; Notable for the following components:      Result Value   Hgb urine dipstick SMALL (*)    All other components within normal limits  CBC WITH DIFFERENTIAL/PLATELET - Abnormal; Notable for the following components:   RBC 3.93 (*)    Hemoglobin 12.8 (*)    All other components within normal limits  URINALYSIS, MICROSCOPIC (REFLEX) - Abnormal; Notable for the following components:   Bacteria, UA RARE (*)    All other components within normal limits  I-STAT CHEM 8, ED - Abnormal; Notable for the following components:   Creatinine, Ser 1.30 (*)    Glucose, Bld 103 (*)    Calcium, Ion 1.14 (*)    Hemoglobin 12.9 (*)    HCT 38.0 (*)    All other components within normal limits    EKG EKG Interpretation  Date/Time:  Saturday January 12 2021 12:29:31 EST Ventricular Rate:  61 PR Interval:  172 QRS Duration: 86 QT Interval:  405 QTC  Calculation: 408 R Axis:   61 Text Interpretation: Sinus arrhythmia No significant change since last tracing Confirmed by Calvert Cantor [phone] on 01/12/2021 2:08:53 PM  Radiology DG Chest 2 View  Result Date: 01/12/2021 CLINICAL DATA:  Fall EXAM: CHEST - 2 VIEW COMPARISON:  None. FINDINGS: Right chest wall generator pack with leads projecting over the lower chest and coarsening upward to the right neck. Cardiac and mediastinal contours are within normal limits. Blunting of the left costophrenic angle. Mild left basilar atelectasis. Lungs otherwise clear. No large pleural effusion or evidence of pneumothorax. Compression deformity of the lower thoracic spine. IMPRESSION: 1. Age-indeterminate compression deformity of the lower thoracic spine, see same day CT for better evaluation. 2. Blunting of the left costophrenic angle, possibly due to pleural thickening versus trace pleural effusion. Electronically Signed   By: Yetta Glassman M.D.   On: 01/12/2021 15:12   CT Head Wo Contrast  Result Date: 01/12/2021 CLINICAL DATA:  [place] out of a truck, hit head, right-sided abrasion EXAM: CT HEAD WITHOUT CONTRAST TECHNIQUE: Contiguous axial images were obtained from the base of the skull through the vertex without intravenous contrast. COMPARISON:  None. FINDINGS: Brain: No acute infarct or hemorrhage. Focal hypodensities in the bilateral basal ganglia consistent with chronic lacunar infarcts. Lateral ventricles and remaining midline structures are unremarkable. No acute extra-axial fluid collections. No mass effect. Vascular: No hyperdense vessel or unexpected calcification. Skull: Minimal soft tissue swelling right frontal scalp. No underlying fracture. The remainder of the calvarium is unremarkable. Sinuses/Orbits: Polypoid mucosal thickening versus mucous retention cyst left maxillary sinus. Remaining sinuses are clear. Other: None. IMPRESSION: 1. Minimal right frontal scalp edema. 2. No acute intracranial  process. Electronically Signed   By: Randa Ngo M.D.   On: 01/12/2021 15:39   CT Cervical Spine Wo Contrast  Result Date: 01/12/2021  CLINICAL DATA:  [place] out of truck, hit head, right shoulder pain EXAM: CT CERVICAL SPINE WITHOUT CONTRAST TECHNIQUE: Multidetector CT imaging of the cervical spine was performed without intravenous contrast. Multiplanar CT image reconstructions were also generated. COMPARISON:  None. FINDINGS: Alignment: Alignment is anatomic. Skull base and vertebrae: No acute fracture. No primary bone lesion or focal pathologic process. Soft tissues and spinal canal: No prevertebral fluid or swelling. No visible canal hematoma. Disc levels: Multilevel spondylosis greatest at C3-4, C5-6, and C6-7. Upper chest: Airway is patent.  Lung apices are clear. Other: Reconstructed images demonstrate no additional findings. Nerve stimulator lead is seen within the right side neck. IMPRESSION: 1. Cervical spondylosis.  No acute fracture. Electronically Signed   By: Randa Ngo M.D.   On: 01/12/2021 15:43   CT Thoracic Spine Wo Contrast  Result Date: 01/12/2021 CLINICAL DATA:  Back trauma, no prior imaging (Age >= 16y) EXAM: CT THORACIC AND LUMBAR SPINE WITHOUT CONTRAST TECHNIQUE: Multidetector CT imaging of the thoracic and lumbar spine was performed without contrast. Multiplanar CT image reconstructions were also generated. COMPARISON:  CT abdomen August 2021 FINDINGS: CT THORACIC SPINE FINDINGS Alignment: No significant listhesis. Vertebrae: Decreased osseous mineralization. There is an acute fracture of the T10 vertebral body with involvement of the anterior and left cortex as well as the superior endplate. Less than 50% loss of vertebral body height and no substantial retropulsion. Thoracic vertebral body heights are otherwise maintained and there is no additional acute fracture. Paraspinal and other soft tissues: 2 mm nonobstructing right renal calculus. No paraspinal hematoma. Disc levels:  No significant osseous encroachment on the spinal canal or neural foramina. CT LUMBAR SPINE FINDINGS Segmentation: 5 lumbar type vertebrae. Alignment: Preserved. Vertebrae: Vertebral body heights are maintained. No acute fracture. Paraspinal and other soft tissues: Unremarkable. Disc levels: Mild facet hypertrophy. No significant osseous encroachment on the spinal canal or neural foramina. IMPRESSION: Acute T10 compression fracture with less than 50% loss of height and no substantial endplate retropulsion. Electronically Signed   By: Macy Mis M.D.   On: 01/12/2021 15:51   CT Lumbar Spine Wo Contrast  Result Date: 01/12/2021 CLINICAL DATA:  Back trauma, no prior imaging (Age >= 16y) EXAM: CT THORACIC AND LUMBAR SPINE WITHOUT CONTRAST TECHNIQUE: Multidetector CT imaging of the thoracic and lumbar spine was performed without contrast. Multiplanar CT image reconstructions were also generated. COMPARISON:  CT abdomen August 2021 FINDINGS: CT THORACIC SPINE FINDINGS Alignment: No significant listhesis. Vertebrae: Decreased osseous mineralization. There is an acute fracture of the T10 vertebral body with involvement of the anterior and left cortex as well as the superior endplate. Less than 50% loss of vertebral body height and no substantial retropulsion. Thoracic vertebral body heights are otherwise maintained and there is no additional acute fracture. Paraspinal and other soft tissues: 2 mm nonobstructing right renal calculus. No paraspinal hematoma. Disc levels: No significant osseous encroachment on the spinal canal or neural foramina. CT LUMBAR SPINE FINDINGS Segmentation: 5 lumbar type vertebrae. Alignment: Preserved. Vertebrae: Vertebral body heights are maintained. No acute fracture. Paraspinal and other soft tissues: Unremarkable. Disc levels: Mild facet hypertrophy. No significant osseous encroachment on the spinal canal or neural foramina. IMPRESSION: Acute T10 compression fracture with less than 50%  loss of height and no substantial endplate retropulsion. Electronically Signed   By: Macy Mis M.D.   On: 01/12/2021 15:51   CT PELVIS WO CONTRAST  Result Date: 01/12/2021 CLINICAL DATA:  Fall.  Hip trauma EXAM: CT PELVIS WITHOUT CONTRAST TECHNIQUE: Multidetector  CT imaging of the pelvis was performed following the standard protocol without intravenous contrast. COMPARISON:  Left femur x-ray 01/12/2021.  CT 08/06/2008. FINDINGS: Urinary Tract:  No abnormality visualized. Bowel: Scattered colonic diverticulosis. No evidence of bowel obstruction or acute bowel inflammation. Vascular/Lymphatic: Scattered aortoiliac atherosclerosis. No pelvic or inguinal lymphadenopathy. Reproductive:  No mass or other significant abnormality Other:  No free fluid within the pelvis.  No hernia. Musculoskeletal: Bony pelvis intact without fracture or diastasis. Bilateral hip joints intact without fracture or dislocation. No lytic or sclerotic bone lesion. No musculotendinous abnormality evident by CT. No soft tissue edema or fluid collection. IMPRESSION: 1. Negative for acute fracture or dislocation of the pelvis or hips. 2. Colonic diverticulosis without evidence of acute diverticulitis. Aortic Atherosclerosis (ICD10-I70.0). Electronically Signed   By: Davina Poke D.O.   On: 01/12/2021 15:44   DG Shoulder Left  Result Date: 01/12/2021 CLINICAL DATA:  Left shoulder pain EXAM: LEFT SHOULDER - 2+ VIEW COMPARISON:  None. FINDINGS: No acute fracture or dislocation identified. Mild narrowing of the glenohumeral joint and acromioclavicular joint. IMPRESSION: No acute osseous abnormality identified. Electronically Signed   By: Ofilia Neas M.D.   On: 01/12/2021 15:09   DG Hand Complete Left  Result Date: 01/12/2021 CLINICAL DATA:  Fall, hand pain EXAM: LEFT HAND - COMPLETE 3+ VIEW COMPARISON:  None. FINDINGS: No acute fracture or dislocation identified. Mild osteoarthritic changes in the distal interphalangeal  joints. No significant soft tissue swelling. IMPRESSION: No acute osseous abnormality identified. Electronically Signed   By: Ofilia Neas M.D.   On: 01/12/2021 15:08   DG Femur Min 2 Views Left  Result Date: 01/12/2021 CLINICAL DATA:  Fall EXAM: LEFT FEMUR 2 VIEWS COMPARISON:  None. FINDINGS: There is no evidence of fracture or other focal bone lesions. Soft tissues are unremarkable. IMPRESSION: No acute osseous abnormality. Electronically Signed   By: Yetta Glassman M.D.   On: 01/12/2021 15:07    Procedures Procedures   Medications Ordered in ED Medications  fentaNYL (SUBLIMAZE) injection 50 mcg (50 mcg Intravenous Given 01/12/21 1647)    ED Course  I have reviewed the triage vital signs and the nursing notes.  Pertinent labs & imaging results that were available during my care of the patient were reviewed by me and considered in my medical decision making (see chart for details).  75 year old male presents emergency department from urgent care for CT scans after a 8-9 foot fall yesterday morning.  On physical exam, patient has an abrasion to the right frontal scalp.  No deformities or step-offs noted to the scalp, neck, or spine.  Tenderness palpation of the midline thoracic/lumbar spine.  Will obtain images due to mechanism of injury.  There was no bleed seen on head CT.  No cervical or lumbar fracture.  No pelvic fracture.  CT thoracic spine pertinent for T10 compression fracture with less than 50% height loss.  Chest x-ray shows no rib fracture, pneumothorax, or pneumonia.  Left shoulder benign.  Left hand shows no sign of fracture or dislocation.  Likely just a contusion to the area.  Left femur shows no sign of fracture or bony lesion.  Lab work shows slightly decreased hemoglobin at 12.9.  No leukocytosis.  Urinalysis shows small amount of blood, otherwise normal.  I-STAT Chem-8 shows elevated creatinine at 1.3.  Previous creatinine over a year ago was 1.09.  Patient was given  fentanyl for his pain and reports he is feeling better.  I discussed the lab and imaging findings  with the patient and stomach and other.  Discussed with him that he will need follow-up with neurosurgery for further evaluation of his T10 compression fracture.  Strict return precautions given to both patient and significant other.  Patient and significant other verbalized understanding.  We will send patient home on ibuprofen and Tylenol as well as a few oxycodones for nighttime.  Patient and significant other agree to plan.  Patient is stable being discharged home in good condition.``    MDM Rules/Calculators/A&P                          Final Clinical Impression(s) / ED Diagnoses Final diagnoses:  Compression fracture of T10 vertebra, initial encounter [facility])    Rx / DC Orders ED Discharge Orders          Ordered    ibuprofen (ADVIL) 600 MG tablet  Every 6 hours PRN        01/12/21 1731    acetaminophen (TYLENOL) 500 MG tablet  Every 6 hours PRN        01/12/21 1731    oxyCODONE (OXY IR/ROXICODONE) 5 MG immediate release tablet  Every 4 hours PRN        01/12/21 1731             Sherrell Puller, PA-C 01/12/21 1733    Truddie Hidden, MD 01/13/21 1102

## 2021-01-12 NOTE — ED Notes (Signed)
Patient is being discharged from the Urgent Care and sent to the Emergency Department via private vehicle . Per PA, patient is in need of higher level of care due to multiple injuries and needs for multiple scans. Patient is aware and verbalizes understanding of plan of care.  Vitals:   01/12/21 1112  BP: (!) 166/81  Pulse: (!) 58  Resp: 18  Temp: 98.1 F (36.7 C)  SpO2: 99%

## 2021-01-12 NOTE — Discharge Instructions (Signed)
Patient is in need of a higher level of evaluation and imaging then we can provide in the urgent care setting.  He suffered a significant fall and has multiple areas of pain that warrant imaging including the neck, thoracic back, low back, coccyx, left thigh, left hand.  Discussed this with patient and he is agreeable to present to the emergency room for more appropriate imaging.

## 2021-01-12 NOTE — ED Triage Notes (Signed)
[place] off a flat bed truck while cutting a tree limb.  Left finger finger swollen, hit head, has abrasion to right side of head.  Right shoulder pain, states back hurts.

## 2021-02-12 DIAGNOSIS — M40204 Unspecified kyphosis, thoracic region: Secondary | ICD-10-CM | POA: Diagnosis not present

## 2021-02-12 DIAGNOSIS — S22070D Wedge compression fracture of T9-T10 vertebra, subsequent encounter for fracture with routine healing: Secondary | ICD-10-CM | POA: Diagnosis not present

## 2021-02-12 DIAGNOSIS — S22070A Wedge compression fracture of T9-T10 vertebra, initial encounter for closed fracture: Secondary | ICD-10-CM | POA: Diagnosis not present

## 2021-05-14 DIAGNOSIS — M47816 Spondylosis without myelopathy or radiculopathy, lumbar region: Secondary | ICD-10-CM | POA: Diagnosis not present

## 2021-05-14 DIAGNOSIS — S22070A Wedge compression fracture of T9-T10 vertebra, initial encounter for closed fracture: Secondary | ICD-10-CM | POA: Diagnosis not present

## 2021-05-14 DIAGNOSIS — M47814 Spondylosis without myelopathy or radiculopathy, thoracic region: Secondary | ICD-10-CM | POA: Diagnosis not present

## 2021-05-14 DIAGNOSIS — I700 Atherosclerosis of aorta: Secondary | ICD-10-CM | POA: Diagnosis not present

## 2021-05-14 DIAGNOSIS — S22070D Wedge compression fracture of T9-T10 vertebra, subsequent encounter for fracture with routine healing: Secondary | ICD-10-CM | POA: Diagnosis not present

## 2021-05-27 DIAGNOSIS — X32XXXA Exposure to sunlight, initial encounter: Secondary | ICD-10-CM | POA: Diagnosis not present

## 2021-05-27 DIAGNOSIS — L570 Actinic keratosis: Secondary | ICD-10-CM | POA: Diagnosis not present

## 2021-05-27 DIAGNOSIS — L821 Other seborrheic keratosis: Secondary | ICD-10-CM | POA: Diagnosis not present

## 2021-05-27 DIAGNOSIS — Z85828 Personal history of other malignant neoplasm of skin: Secondary | ICD-10-CM | POA: Diagnosis not present

## 2021-05-27 DIAGNOSIS — D2271 Melanocytic nevi of right lower limb, including hip: Secondary | ICD-10-CM | POA: Diagnosis not present

## 2021-05-27 DIAGNOSIS — D2261 Melanocytic nevi of right upper limb, including shoulder: Secondary | ICD-10-CM | POA: Diagnosis not present

## 2021-05-27 DIAGNOSIS — D225 Melanocytic nevi of trunk: Secondary | ICD-10-CM | POA: Diagnosis not present

## 2021-06-07 DIAGNOSIS — G4733 Obstructive sleep apnea (adult) (pediatric): Secondary | ICD-10-CM | POA: Diagnosis not present

## 2021-12-11 DIAGNOSIS — G4733 Obstructive sleep apnea (adult) (pediatric): Secondary | ICD-10-CM | POA: Diagnosis not present

## 2021-12-12 DIAGNOSIS — Z0001 Encounter for general adult medical examination with abnormal findings: Secondary | ICD-10-CM | POA: Diagnosis not present

## 2021-12-12 DIAGNOSIS — G4733 Obstructive sleep apnea (adult) (pediatric): Secondary | ICD-10-CM | POA: Diagnosis not present

## 2021-12-12 DIAGNOSIS — E7800 Pure hypercholesterolemia, unspecified: Secondary | ICD-10-CM | POA: Diagnosis not present

## 2021-12-12 DIAGNOSIS — K219 Gastro-esophageal reflux disease without esophagitis: Secondary | ICD-10-CM | POA: Diagnosis not present

## 2021-12-12 DIAGNOSIS — Z0000 Encounter for general adult medical examination without abnormal findings: Secondary | ICD-10-CM | POA: Diagnosis not present

## 2021-12-12 DIAGNOSIS — Z125 Encounter for screening for malignant neoplasm of prostate: Secondary | ICD-10-CM | POA: Diagnosis not present

## 2021-12-12 DIAGNOSIS — Z1331 Encounter for screening for depression: Secondary | ICD-10-CM | POA: Diagnosis not present

## 2021-12-12 DIAGNOSIS — I10 Essential (primary) hypertension: Secondary | ICD-10-CM | POA: Diagnosis not present

## 2021-12-12 DIAGNOSIS — Z23 Encounter for immunization: Secondary | ICD-10-CM | POA: Diagnosis not present

## 2022-01-24 DIAGNOSIS — I10 Essential (primary) hypertension: Secondary | ICD-10-CM | POA: Diagnosis not present

## 2022-01-24 DIAGNOSIS — J0140 Acute pansinusitis, unspecified: Secondary | ICD-10-CM | POA: Diagnosis not present

## 2022-01-24 DIAGNOSIS — Z6822 Body mass index (BMI) 22.0-22.9, adult: Secondary | ICD-10-CM | POA: Diagnosis not present

## 2022-02-07 DIAGNOSIS — M546 Pain in thoracic spine: Secondary | ICD-10-CM | POA: Diagnosis not present

## 2022-02-13 ENCOUNTER — Other Ambulatory Visit: Payer: Self-pay | Admitting: Orthopedic Surgery

## 2022-02-13 DIAGNOSIS — M546 Pain in thoracic spine: Secondary | ICD-10-CM

## 2022-02-25 ENCOUNTER — Ambulatory Visit
Admission: RE | Admit: 2022-02-25 | Discharge: 2022-02-25 | Disposition: A | Discharge status: Home / Self Care | Payer: Medicare HMO | Source: Ambulatory Visit | Attending: Orthopedic Surgery | Admitting: Orthopedic Surgery

## 2022-02-25 DIAGNOSIS — M546 Pain in thoracic spine: Secondary | ICD-10-CM | POA: Insufficient documentation

## 2022-02-25 DIAGNOSIS — M40204 Unspecified kyphosis, thoracic region: Secondary | ICD-10-CM | POA: Diagnosis not present

## 2022-03-04 DIAGNOSIS — H26493 Other secondary cataract, bilateral: Secondary | ICD-10-CM | POA: Diagnosis not present

## 2022-03-04 DIAGNOSIS — H43812 Vitreous degeneration, left eye: Secondary | ICD-10-CM | POA: Diagnosis not present

## 2022-03-14 DIAGNOSIS — M546 Pain in thoracic spine: Secondary | ICD-10-CM | POA: Diagnosis not present

## 2022-04-10 DIAGNOSIS — S22009D Unspecified fracture of unspecified thoracic vertebra, subsequent encounter for fracture with routine healing: Secondary | ICD-10-CM | POA: Diagnosis not present

## 2022-04-10 DIAGNOSIS — M792 Neuralgia and neuritis, unspecified: Secondary | ICD-10-CM | POA: Diagnosis not present

## 2022-04-10 DIAGNOSIS — M5414 Radiculopathy, thoracic region: Secondary | ICD-10-CM | POA: Diagnosis not present

## 2022-04-10 DIAGNOSIS — M791 Myalgia, unspecified site: Secondary | ICD-10-CM | POA: Diagnosis not present

## 2022-04-14 DIAGNOSIS — I10 Essential (primary) hypertension: Secondary | ICD-10-CM | POA: Diagnosis not present

## 2022-04-28 DIAGNOSIS — G4733 Obstructive sleep apnea (adult) (pediatric): Secondary | ICD-10-CM | POA: Diagnosis not present

## 2022-04-28 DIAGNOSIS — I10 Essential (primary) hypertension: Secondary | ICD-10-CM | POA: Diagnosis not present

## 2022-05-29 DIAGNOSIS — D2272 Melanocytic nevi of left lower limb, including hip: Secondary | ICD-10-CM | POA: Diagnosis not present

## 2022-05-29 DIAGNOSIS — D225 Melanocytic nevi of trunk: Secondary | ICD-10-CM | POA: Diagnosis not present

## 2022-05-29 DIAGNOSIS — L718 Other rosacea: Secondary | ICD-10-CM | POA: Diagnosis not present

## 2022-05-29 DIAGNOSIS — Z08 Encounter for follow-up examination after completed treatment for malignant neoplasm: Secondary | ICD-10-CM | POA: Diagnosis not present

## 2022-05-29 DIAGNOSIS — D2261 Melanocytic nevi of right upper limb, including shoulder: Secondary | ICD-10-CM | POA: Diagnosis not present

## 2022-05-29 DIAGNOSIS — C44319 Basal cell carcinoma of skin of other parts of face: Secondary | ICD-10-CM | POA: Diagnosis not present

## 2022-05-29 DIAGNOSIS — L821 Other seborrheic keratosis: Secondary | ICD-10-CM | POA: Diagnosis not present

## 2022-05-29 DIAGNOSIS — Z85828 Personal history of other malignant neoplasm of skin: Secondary | ICD-10-CM | POA: Diagnosis not present

## 2022-05-29 DIAGNOSIS — D2262 Melanocytic nevi of left upper limb, including shoulder: Secondary | ICD-10-CM | POA: Diagnosis not present

## 2022-05-29 DIAGNOSIS — D2271 Melanocytic nevi of right lower limb, including hip: Secondary | ICD-10-CM | POA: Diagnosis not present

## 2022-06-12 DIAGNOSIS — S22009D Unspecified fracture of unspecified thoracic vertebra, subsequent encounter for fracture with routine healing: Secondary | ICD-10-CM | POA: Diagnosis not present

## 2022-06-12 DIAGNOSIS — G4733 Obstructive sleep apnea (adult) (pediatric): Secondary | ICD-10-CM | POA: Diagnosis not present

## 2022-06-12 DIAGNOSIS — M5414 Radiculopathy, thoracic region: Secondary | ICD-10-CM | POA: Diagnosis not present

## 2022-06-12 DIAGNOSIS — M791 Myalgia, unspecified site: Secondary | ICD-10-CM | POA: Diagnosis not present

## 2022-07-02 DIAGNOSIS — M5414 Radiculopathy, thoracic region: Secondary | ICD-10-CM | POA: Diagnosis not present

## 2022-07-10 DIAGNOSIS — C44319 Basal cell carcinoma of skin of other parts of face: Secondary | ICD-10-CM | POA: Diagnosis not present

## 2022-08-14 DIAGNOSIS — M5414 Radiculopathy, thoracic region: Secondary | ICD-10-CM | POA: Diagnosis not present

## 2022-08-14 DIAGNOSIS — M791 Myalgia, unspecified site: Secondary | ICD-10-CM | POA: Diagnosis not present

## 2022-08-14 DIAGNOSIS — M792 Neuralgia and neuritis, unspecified: Secondary | ICD-10-CM | POA: Diagnosis not present

## 2022-08-14 DIAGNOSIS — S22009D Unspecified fracture of unspecified thoracic vertebra, subsequent encounter for fracture with routine healing: Secondary | ICD-10-CM | POA: Diagnosis not present

## 2022-10-22 DIAGNOSIS — M5414 Radiculopathy, thoracic region: Secondary | ICD-10-CM | POA: Diagnosis not present

## 2022-12-08 DIAGNOSIS — D225 Melanocytic nevi of trunk: Secondary | ICD-10-CM | POA: Diagnosis not present

## 2022-12-08 DIAGNOSIS — L570 Actinic keratosis: Secondary | ICD-10-CM | POA: Diagnosis not present

## 2022-12-08 DIAGNOSIS — D2261 Melanocytic nevi of right upper limb, including shoulder: Secondary | ICD-10-CM | POA: Diagnosis not present

## 2022-12-08 DIAGNOSIS — X32XXXA Exposure to sunlight, initial encounter: Secondary | ICD-10-CM | POA: Diagnosis not present

## 2022-12-08 DIAGNOSIS — Z85828 Personal history of other malignant neoplasm of skin: Secondary | ICD-10-CM | POA: Diagnosis not present

## 2022-12-08 DIAGNOSIS — D2271 Melanocytic nevi of right lower limb, including hip: Secondary | ICD-10-CM | POA: Diagnosis not present

## 2022-12-08 DIAGNOSIS — D2262 Melanocytic nevi of left upper limb, including shoulder: Secondary | ICD-10-CM | POA: Diagnosis not present

## 2022-12-10 DIAGNOSIS — I10 Essential (primary) hypertension: Secondary | ICD-10-CM | POA: Diagnosis not present

## 2022-12-17 DIAGNOSIS — G4733 Obstructive sleep apnea (adult) (pediatric): Secondary | ICD-10-CM | POA: Diagnosis not present

## 2022-12-17 DIAGNOSIS — Z0001 Encounter for general adult medical examination with abnormal findings: Secondary | ICD-10-CM | POA: Diagnosis not present

## 2022-12-17 DIAGNOSIS — Z125 Encounter for screening for malignant neoplasm of prostate: Secondary | ICD-10-CM | POA: Diagnosis not present

## 2022-12-17 DIAGNOSIS — Z1331 Encounter for screening for depression: Secondary | ICD-10-CM | POA: Diagnosis not present

## 2022-12-17 DIAGNOSIS — I10 Essential (primary) hypertension: Secondary | ICD-10-CM | POA: Diagnosis not present

## 2022-12-17 DIAGNOSIS — Z23 Encounter for immunization: Secondary | ICD-10-CM | POA: Diagnosis not present

## 2022-12-17 DIAGNOSIS — Z0000 Encounter for general adult medical examination without abnormal findings: Secondary | ICD-10-CM | POA: Diagnosis not present

## 2022-12-17 DIAGNOSIS — E7800 Pure hypercholesterolemia, unspecified: Secondary | ICD-10-CM | POA: Diagnosis not present

## 2023-01-08 IMAGING — CT CT PELVIS W/O CM
3 of 4 series · 14 of 33 positions shown, 17 images · non-contrast
Comparison: Left femur x-ray 01/12/2021.  CT 08/06/2008.

CLINICAL DATA: Fall.  Hip trauma

EXAM:
CT PELVIS WITHOUT CONTRAST
TECHNIQUE: Multidetector CT imaging of the pelvis was performed following the
standard protocol without intravenous contrast.

[Series 6: 3 axial soft · axial · 0.86mm/px · z∈[+412,+654]mm · 8 of 143 slices shown, 10 images]
[im 11/143  soft-tissue]
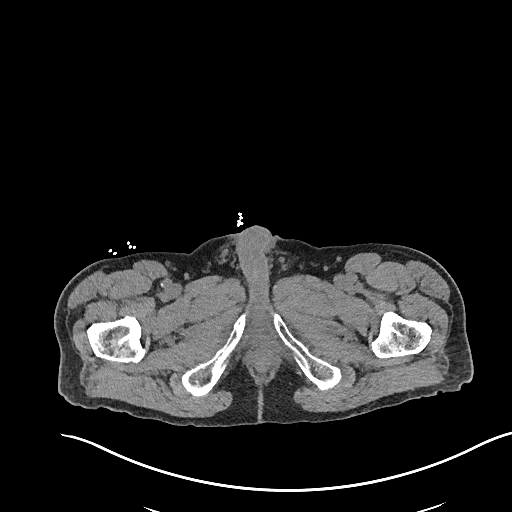
[im 11/143  bone]
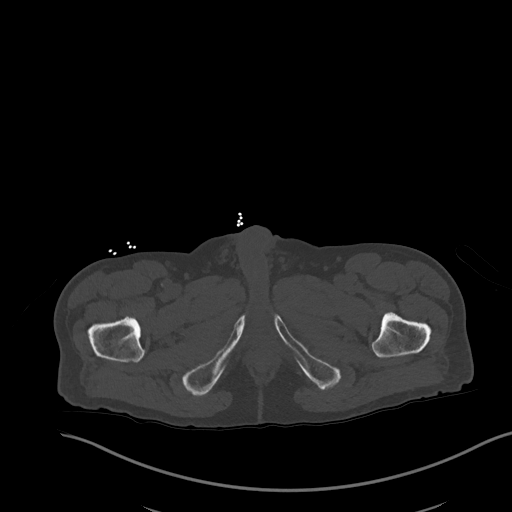
[im 33/143  bone]
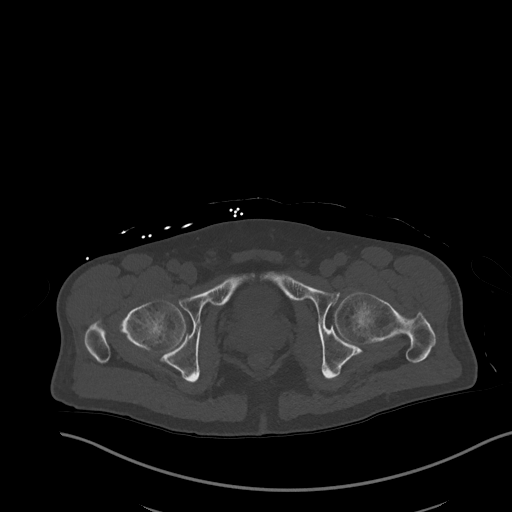
[im 44/143  bone]
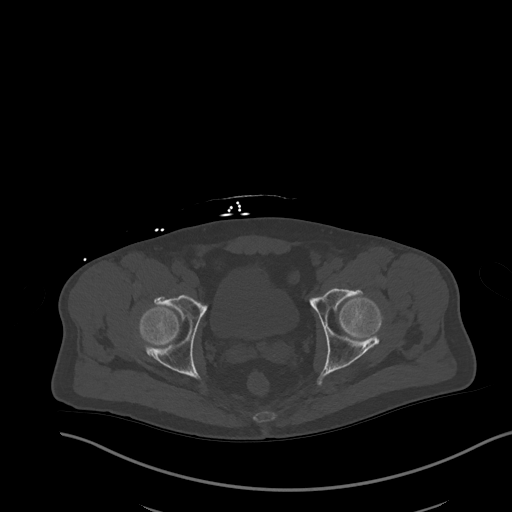
[im 66/143  bone]
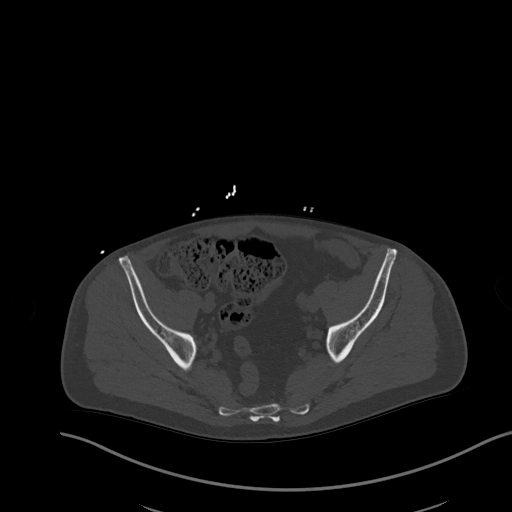
[im 77/143  soft-tissue]
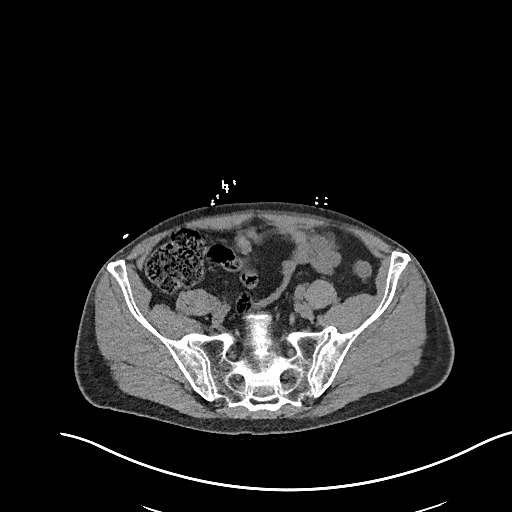
[im 77/143  bone]
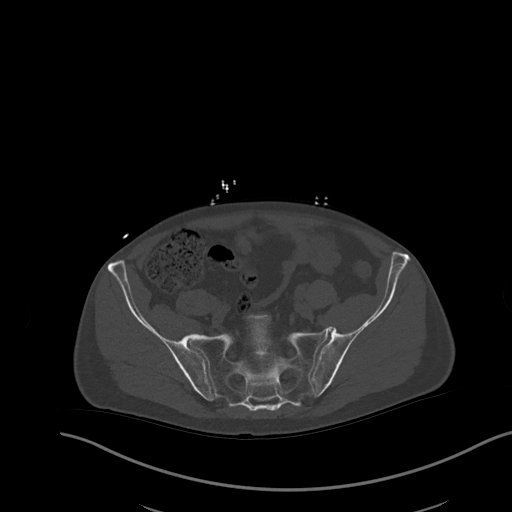
[im 99/143  bone]
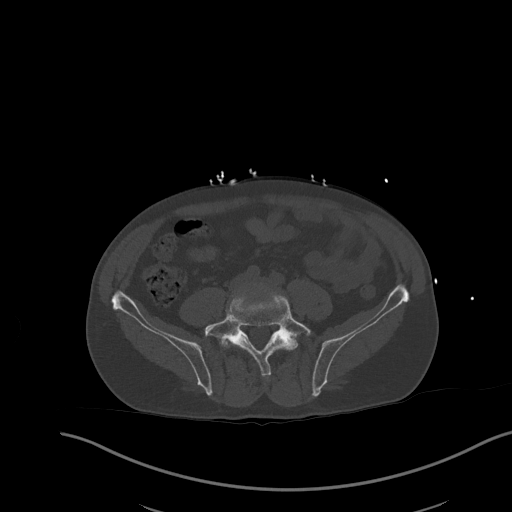
[im 110/143  bone]
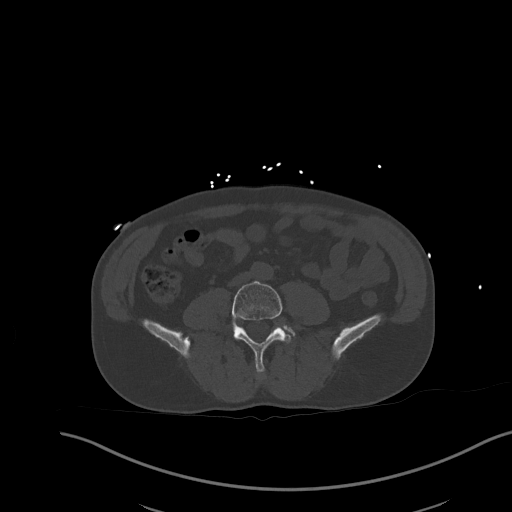
[im 132/143  bone]
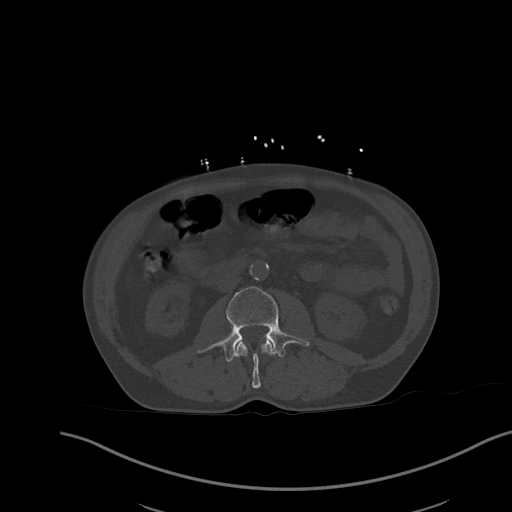

[Series 7: coronal bone · coronal · 0.61mm/px · 1 of 137 slices shown]
[im 69/137  bone]
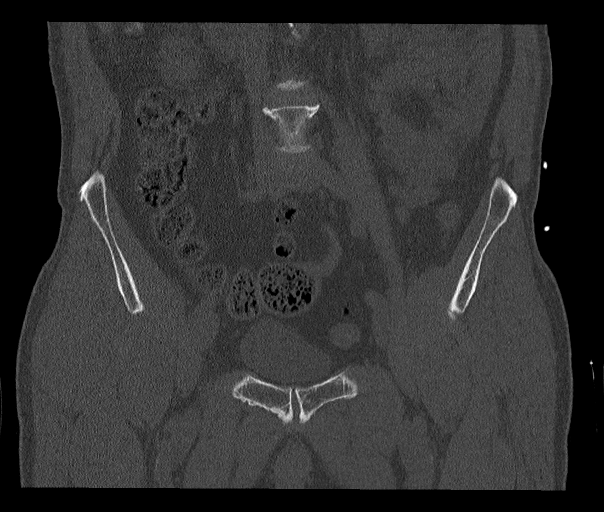

[Series 10: sagittal st · sagittal · 0.50mm/px · 5 of 185 slices shown, 6 images]
[im 62/185  bone]
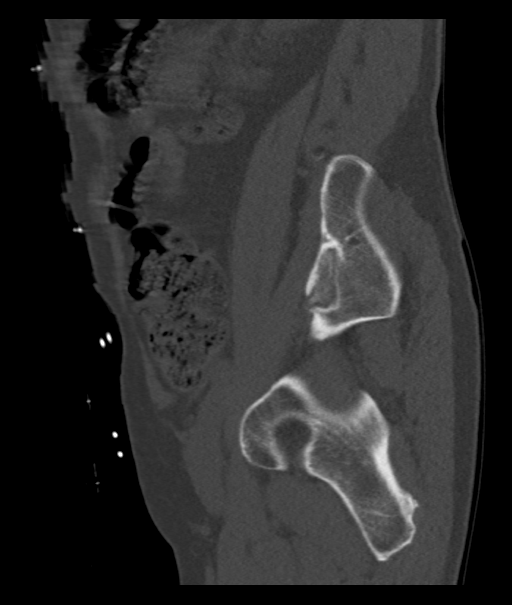
[im 77/185  bone]
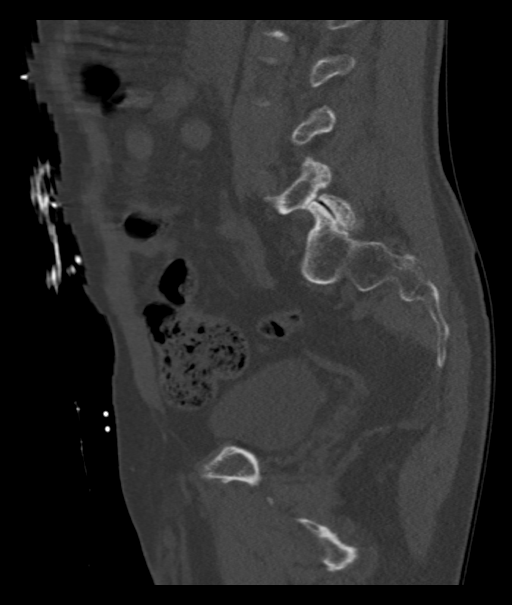
[im 93/185  soft-tissue]
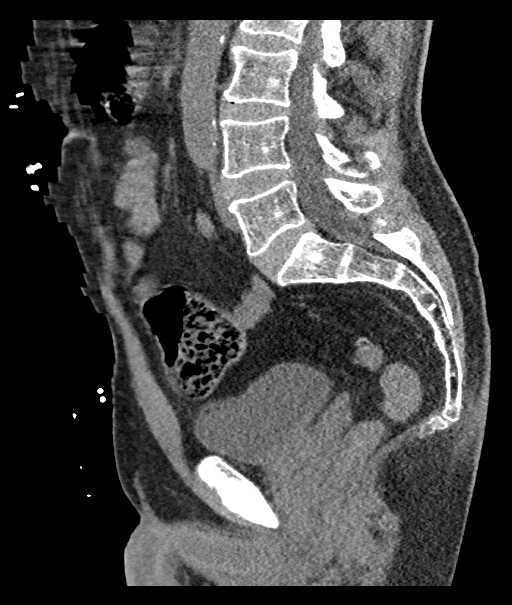
[im 93/185  bone]
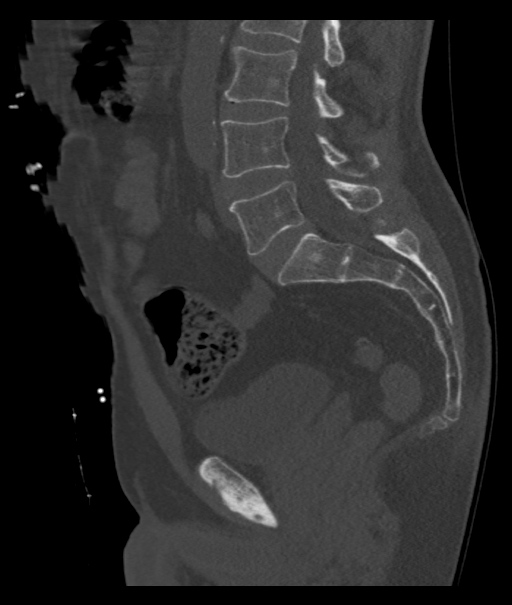
[im 108/185  bone]
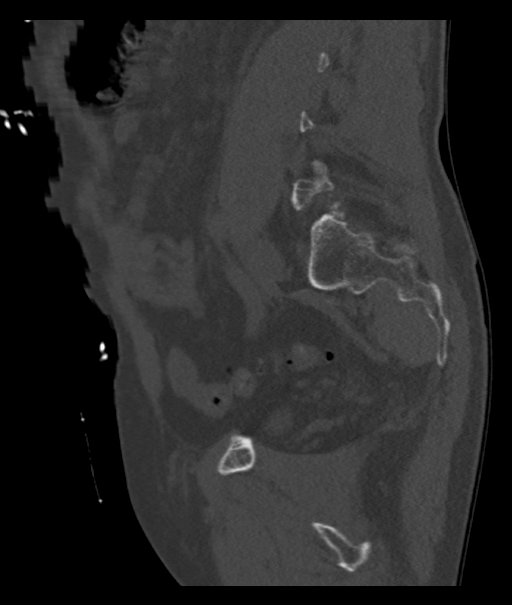
[im 123/185  bone]
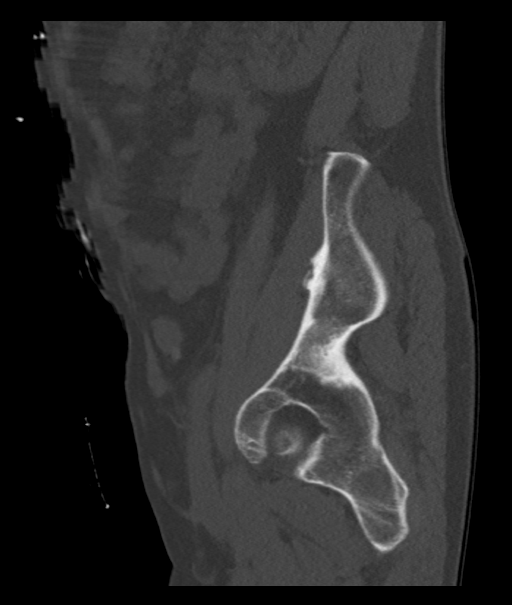

[14 of 33 positions shown; findings below may reference images not displayed]

FINDINGS: Urinary Tract:  No abnormality visualized.

Bowel: Scattered colonic diverticulosis. No evidence of bowel
obstruction or acute bowel inflammation.

Vascular/Lymphatic: Scattered aortoiliac atherosclerosis. No pelvic
or inguinal lymphadenopathy.

Reproductive:  No mass or other significant abnormality

Other:  No free fluid within the pelvis.  No hernia.

Musculoskeletal: Bony pelvis intact without fracture or diastasis.
Bilateral hip joints intact without fracture or dislocation. No
lytic or sclerotic bone lesion. No musculotendinous abnormality
evident by CT. No soft tissue edema or fluid collection.
IMPRESSION: 1. Negative for acute fracture or dislocation of the pelvis or hips.
2. Colonic diverticulosis without evidence of acute diverticulitis.

Aortic Atherosclerosis ([id number]).

## 2023-01-08 IMAGING — CT CT T SPINE W/O CM
3 of 5 series · 10 of 33 positions shown, 11 images · non-contrast
Comparison: CT abdomen September 2019

CLINICAL DATA: Back trauma, no prior imaging (Age >= 16y)

EXAM:
CT THORACIC AND LUMBAR SPINE WITHOUT CONTRAST
TECHNIQUE: Multidetector CT imaging of the thoracic and lumbar spine was
performed without contrast. Multiplanar CT image reconstructions
were also generated.

[Series 4: t spine bone · axial · 0.42mm/px · z∈[+797,+917]mm · 2 of 181 slices shown, 3 images]
[im 61/181  soft-tissue]
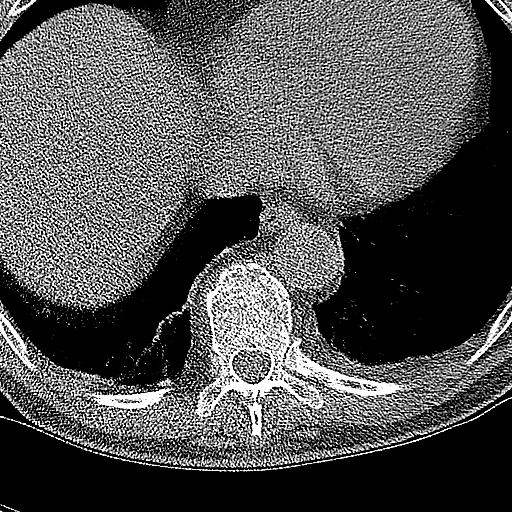
[im 61/181  bone]
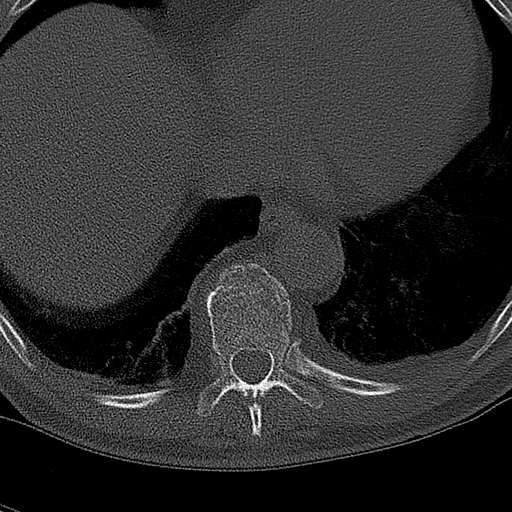
[im 121/181  bone]
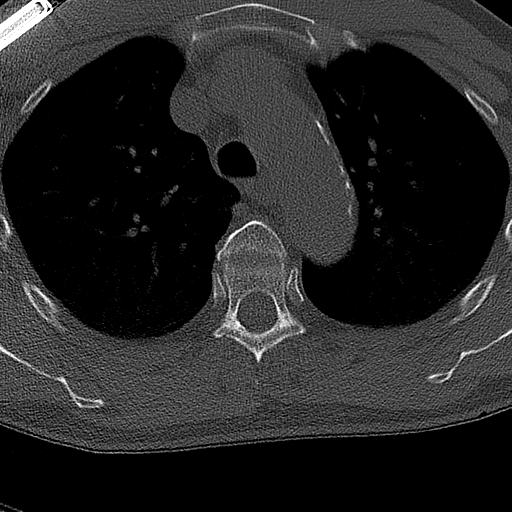

[Series 6: sag bone · sagittal · 0.42mm/px · 5 of 78 slices shown]
[im 26/78  bone]
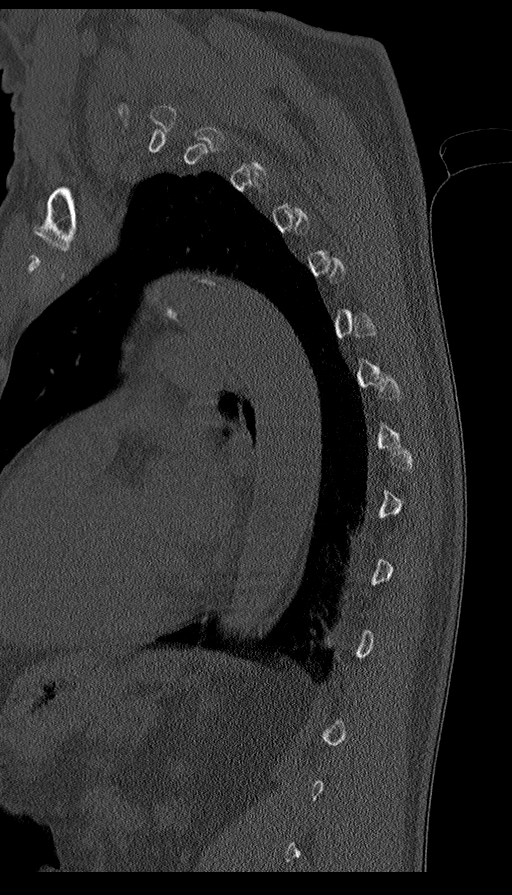
[im 33/78  bone]
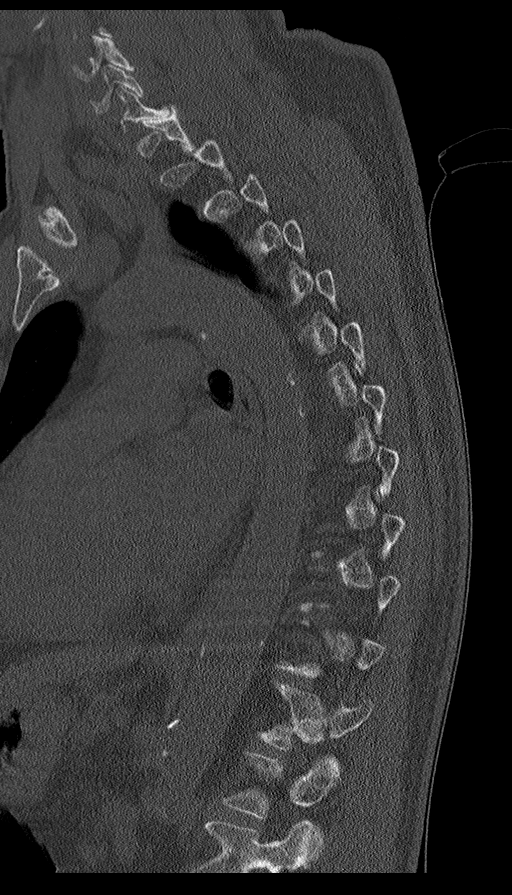
[im 39/78  bone]
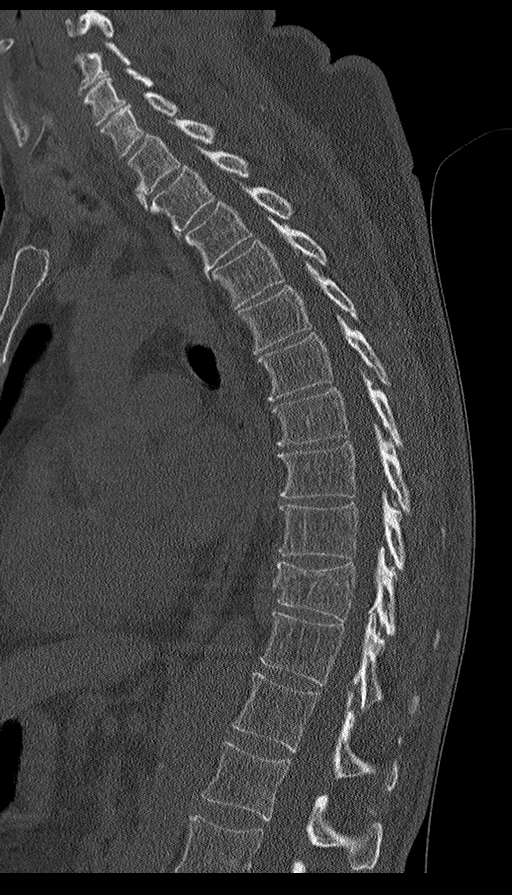
[im 45/78  bone]
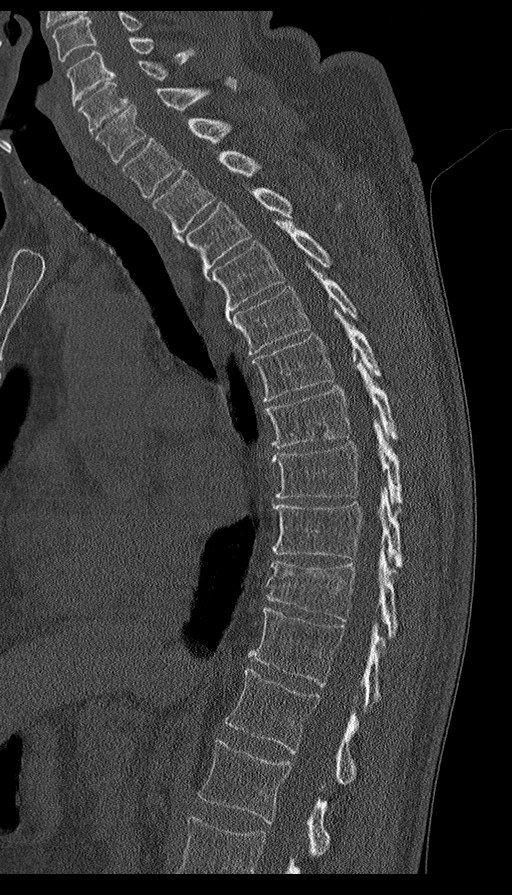
[im 52/78  bone]
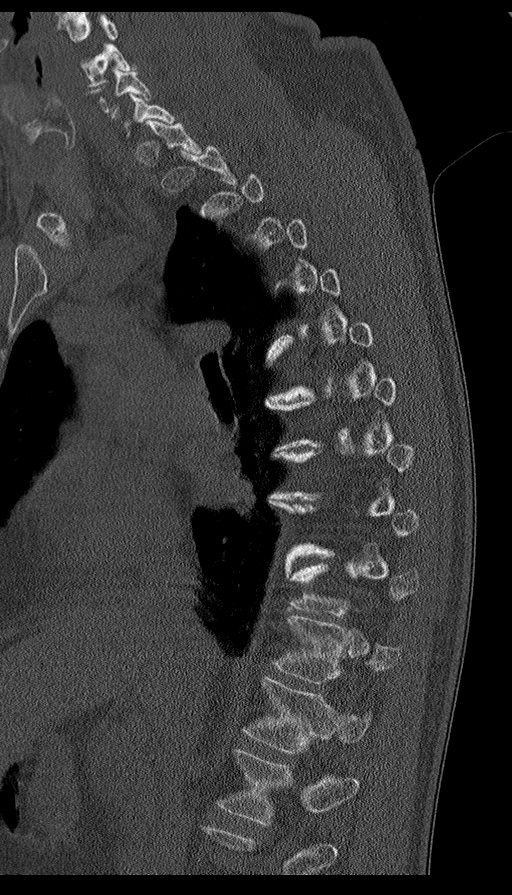

[Series 7: cor bone · coronal · 0.29mm/px · 3 of 109 slices shown]
[im 22/109  bone]
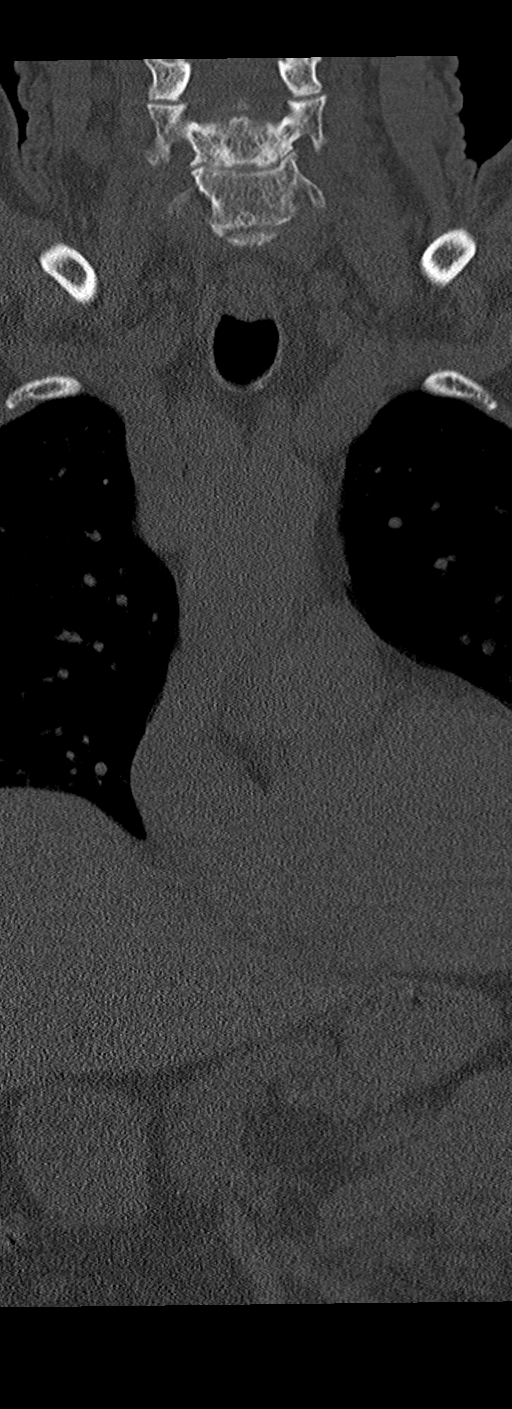
[im 44/109  bone]
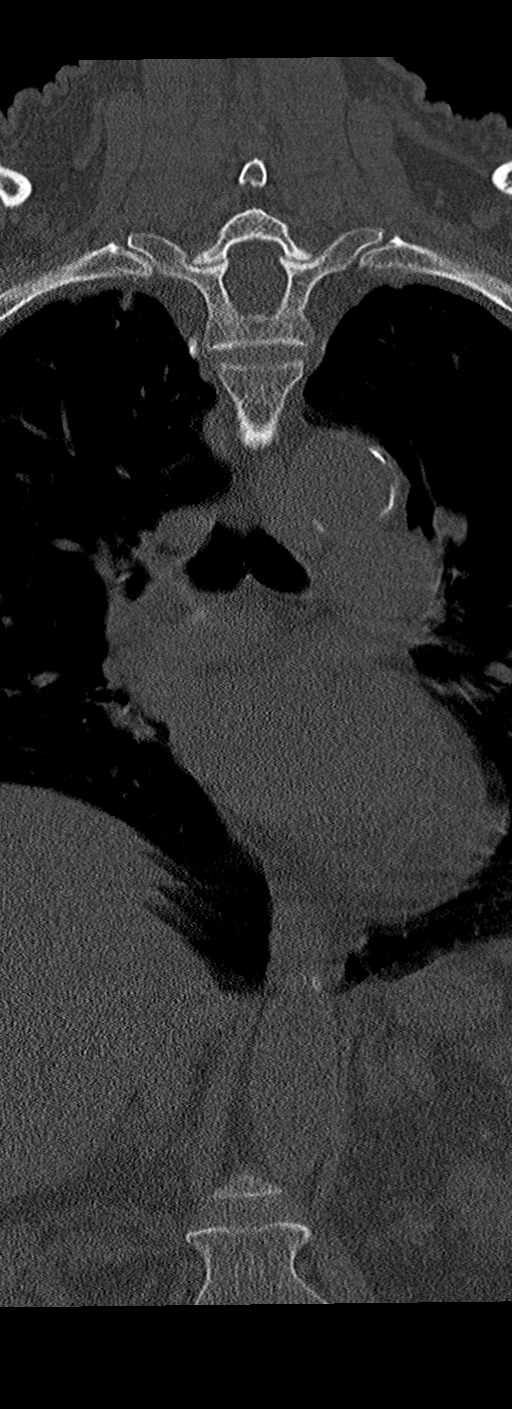
[im 65/109  bone]
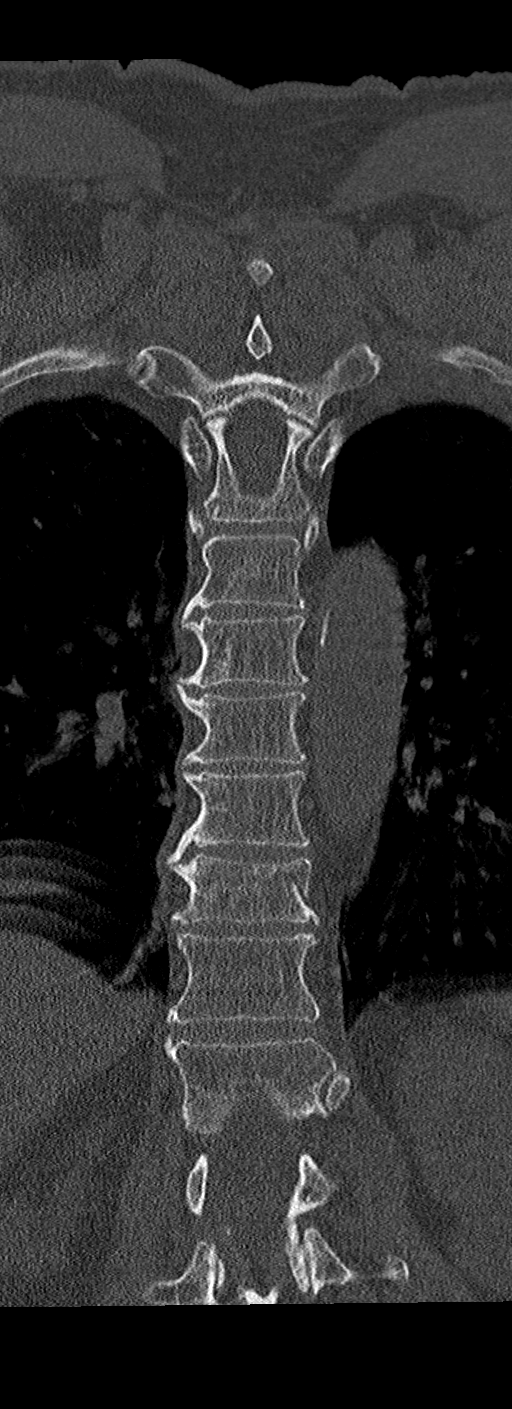

[10 of 33 positions shown; findings below may reference images not displayed]

FINDINGS: CT THORACIC SPINE FINDINGS

Alignment: No significant listhesis.

Vertebrae: Decreased osseous mineralization. There is an acute
fracture of the T10 vertebral body with involvement of the anterior
and left cortex as well as the superior endplate. Less than 50% loss
of vertebral body height and no substantial retropulsion. Thoracic
vertebral body heights are otherwise maintained and there is no
additional acute fracture.

Paraspinal and other soft tissues: 2 mm nonobstructing right renal
calculus. No paraspinal hematoma.

Disc levels: No significant osseous encroachment on the spinal canal
or neural foramina.

CT LUMBAR SPINE FINDINGS

Segmentation: 5 lumbar type vertebrae.

Alignment: Preserved.

Vertebrae: Vertebral body heights are maintained. No acute fracture.

Paraspinal and other soft tissues: Unremarkable.

Disc levels: Mild facet hypertrophy. No significant osseous
encroachment on the spinal canal or neural foramina.
IMPRESSION: Acute T10 compression fracture with less than 50% loss of height and
no substantial endplate retropulsion.

## 2023-01-21 DIAGNOSIS — G4733 Obstructive sleep apnea (adult) (pediatric): Secondary | ICD-10-CM | POA: Diagnosis not present

## 2023-03-11 DIAGNOSIS — H43812 Vitreous degeneration, left eye: Secondary | ICD-10-CM | POA: Diagnosis not present

## 2023-03-11 DIAGNOSIS — H26493 Other secondary cataract, bilateral: Secondary | ICD-10-CM | POA: Diagnosis not present

## 2023-05-29 DIAGNOSIS — I10 Essential (primary) hypertension: Secondary | ICD-10-CM | POA: Diagnosis not present
